# Patient Record
Sex: Male | Born: 1975 | Race: White | Hispanic: No | Marital: Married | Smoking: Never smoker
Health system: Southern US, Community
[De-identification: ages and names within clinical notes are randomized; demographics above are authoritative.]

## PROBLEM LIST (undated history)

## (undated) ENCOUNTER — Ambulatory Visit: Payer: 59 | Source: Home / Self Care

## (undated) HISTORY — PX: WISDOM TOOTH EXTRACTION: SHX21

---

## 1998-03-29 ENCOUNTER — Emergency Department (HOSPITAL_COMMUNITY): Admission: EM | Admit: 1998-03-29 | Discharge: 1998-03-29 | Payer: Self-pay | Admitting: Emergency Medicine

## 1998-03-30 ENCOUNTER — Encounter: Payer: Self-pay | Admitting: Emergency Medicine

## 2002-07-06 ENCOUNTER — Emergency Department (HOSPITAL_COMMUNITY): Admission: EM | Admit: 2002-07-06 | Discharge: 2002-07-06 | Payer: Self-pay | Admitting: Emergency Medicine

## 2006-01-31 ENCOUNTER — Emergency Department (HOSPITAL_COMMUNITY): Admission: EM | Admit: 2006-01-31 | Discharge: 2006-01-31 | Payer: Self-pay | Admitting: Emergency Medicine

## 2007-02-24 IMAGING — CR DG ANKLE COMPLETE 3+V*R*
3 series · 3 of 3 positions shown · non-contrast
Comparison: None

CLINICAL DATA: Fall with pain at midline.
 RIGHT ANKLE ? 3 VIEW:

[view not recorded (1 of 3)]
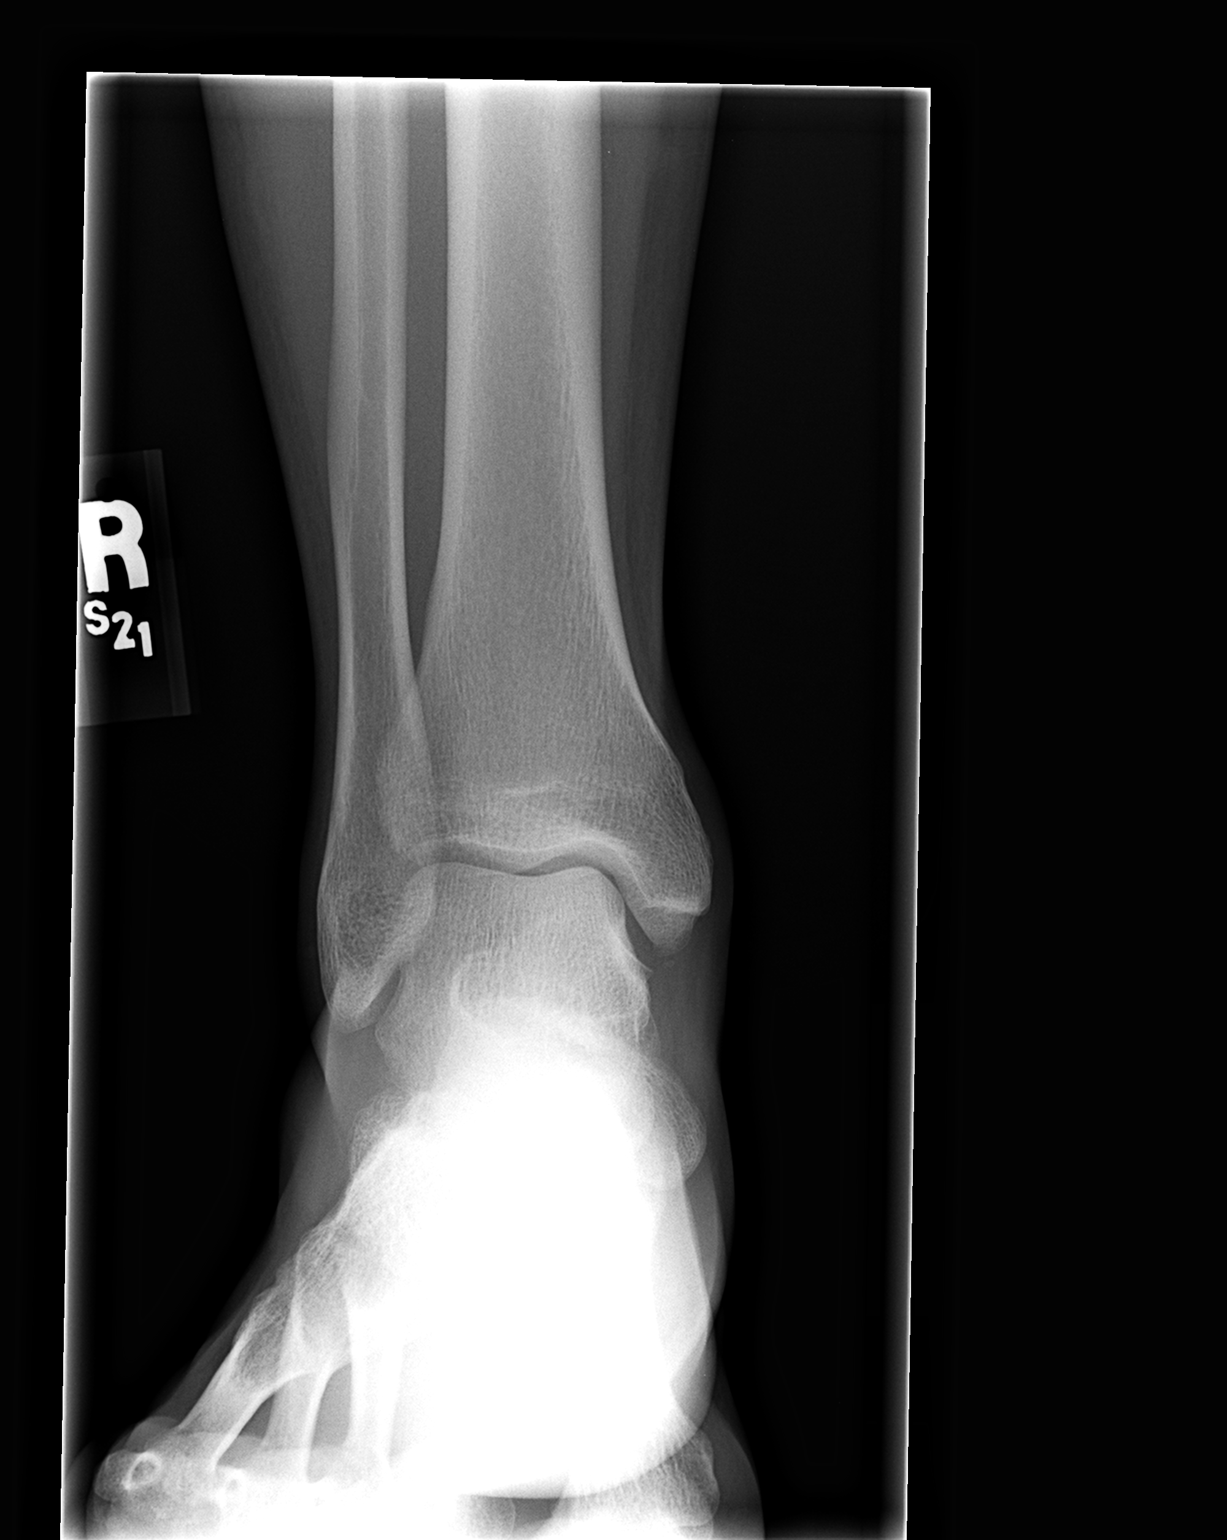

[view not recorded (2 of 3)]
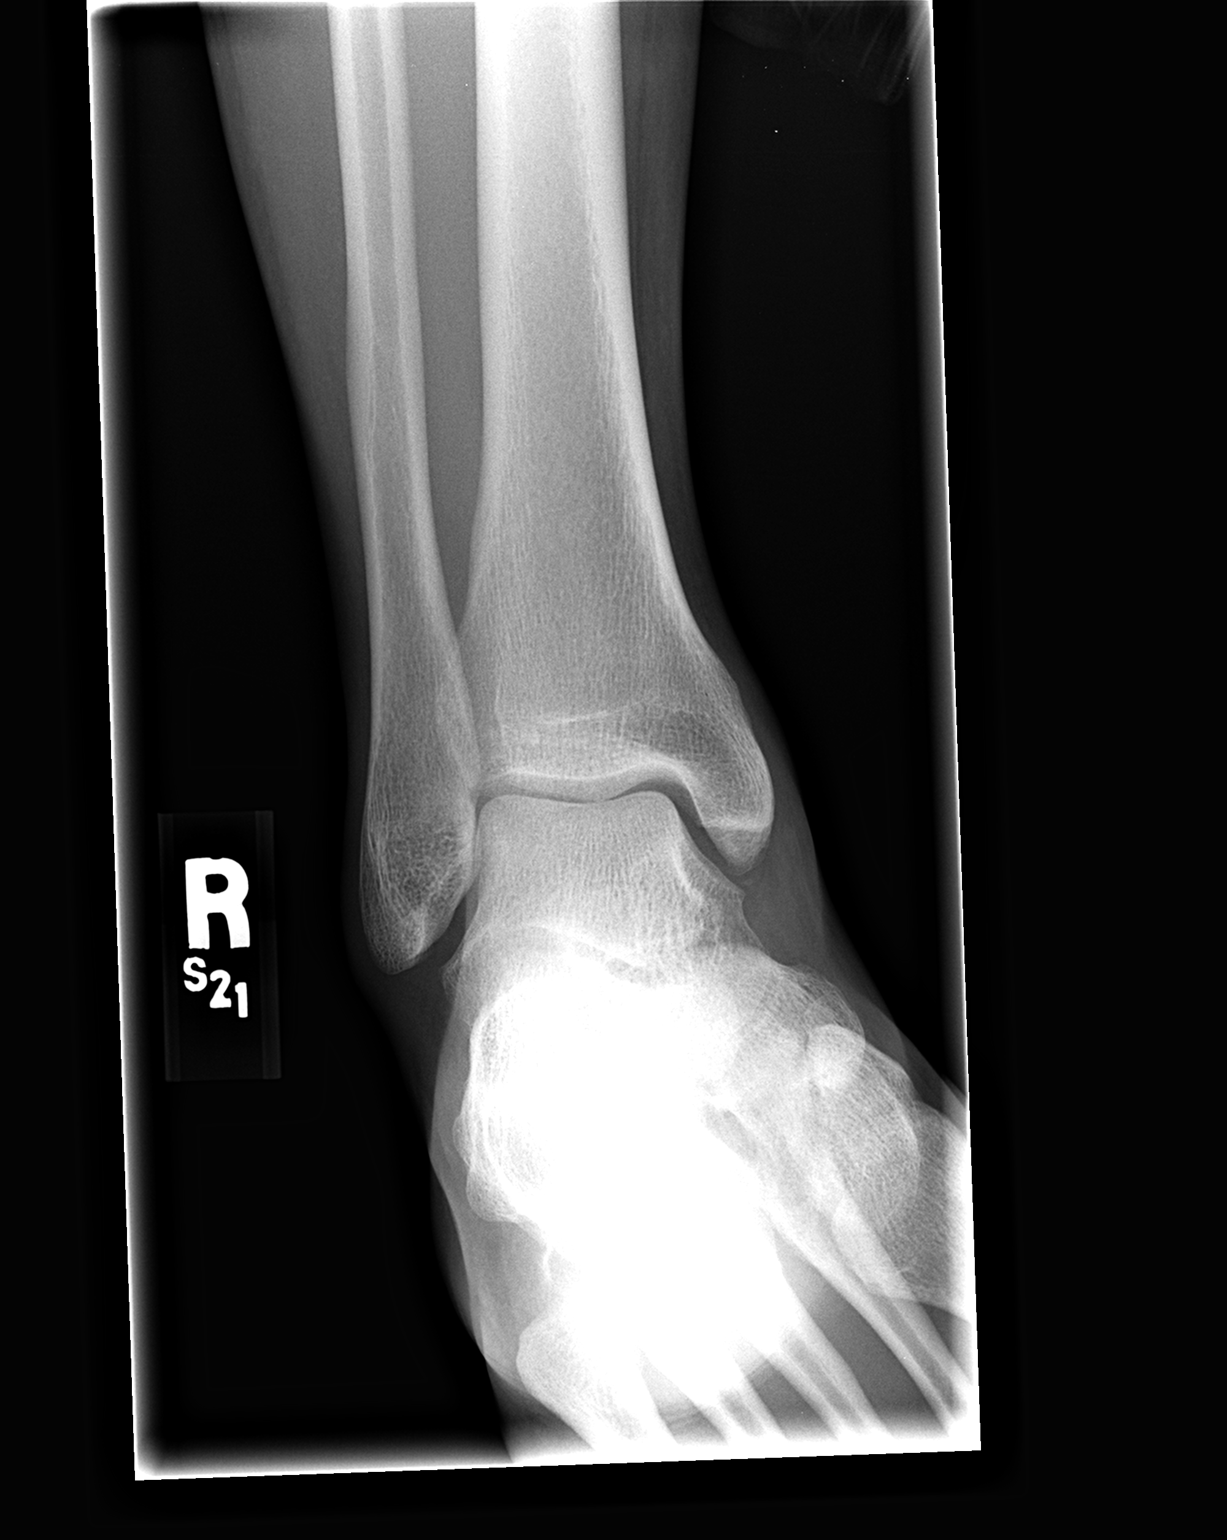

[view not recorded (3 of 3)]
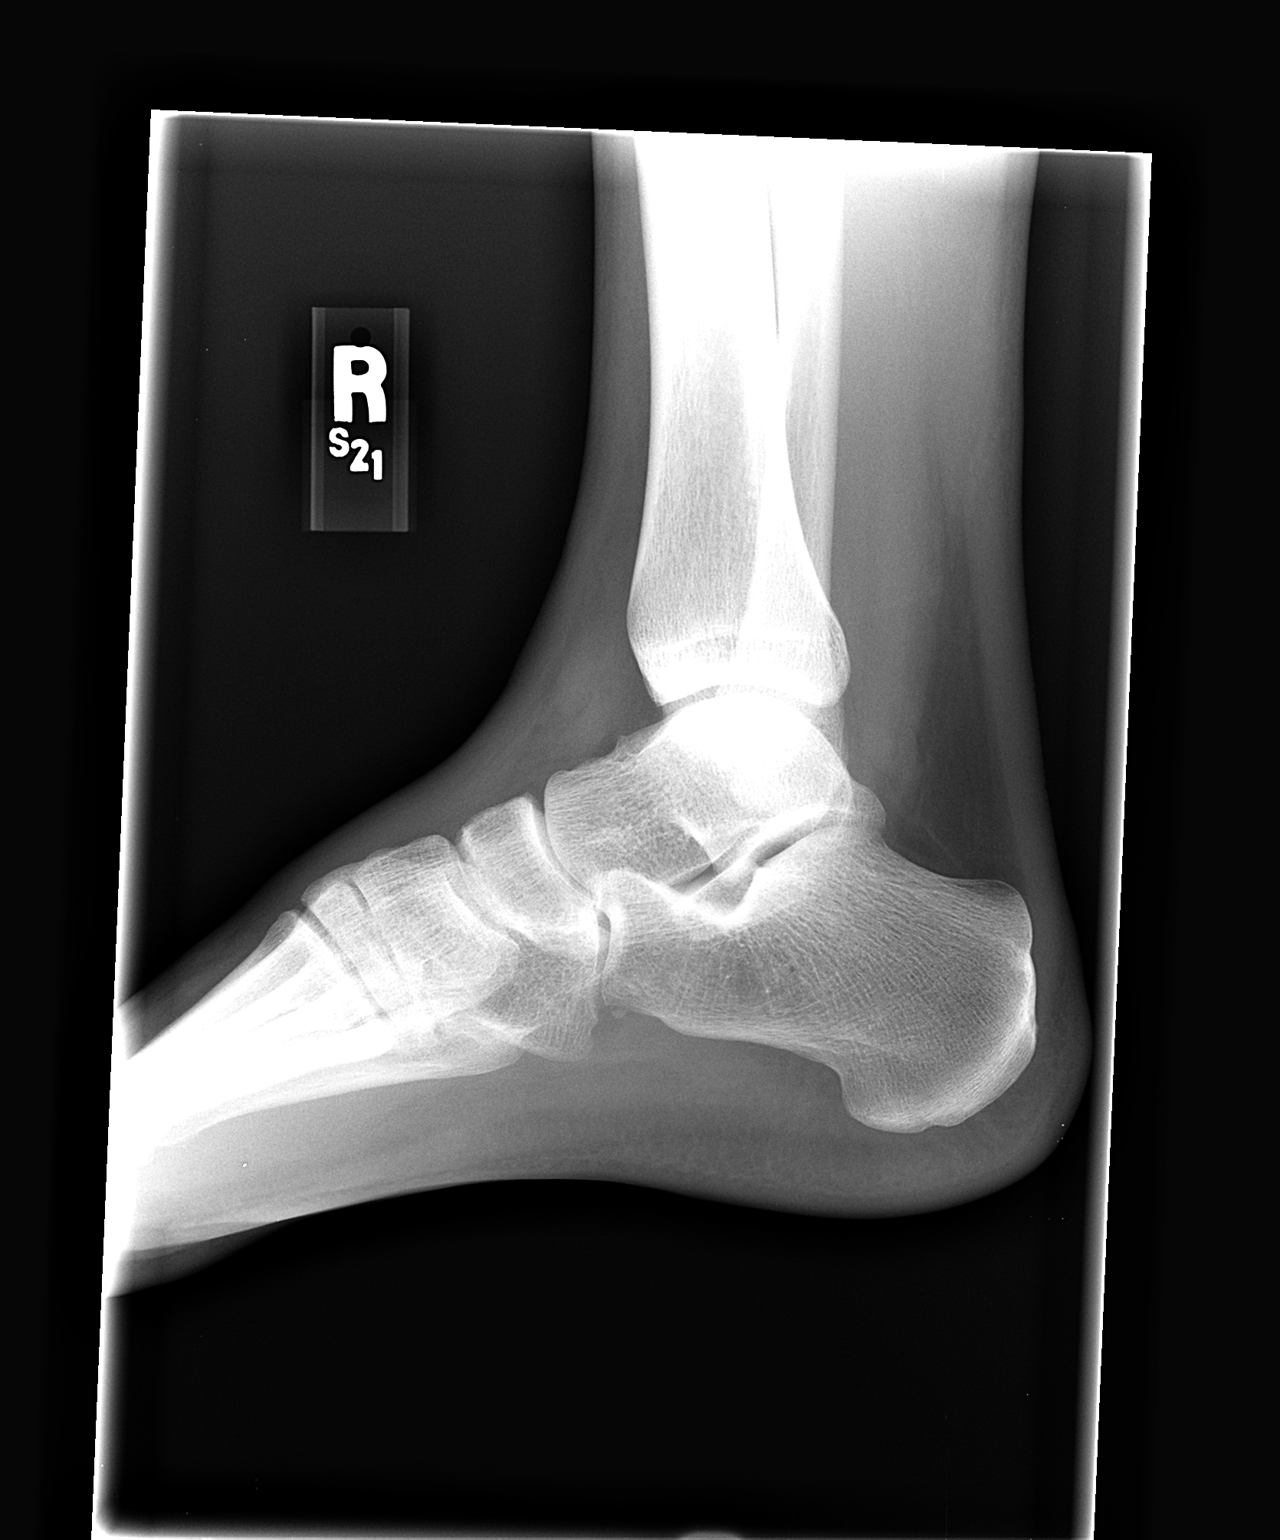

[3 of 3 positions shown; findings below may reference images not displayed]

FINDINGS: There is a tiny ossific fragment distal to the medial malleolus.  There may be minimal medial malleolar soft tissue swelling.  On the lateral view, a tiny ossific density adjacent to the calcaneus likely represents accessory ossicle.
IMPRESSION: Tiny ossific fragment distal to the medial malleolus with suggestion of soft tissue swelling.  This is likely due to age indeterminate trauma.  Correlate with point tenderness.

## 2016-05-06 ENCOUNTER — Encounter: Payer: Self-pay | Admitting: Family Medicine

## 2016-05-06 ENCOUNTER — Ambulatory Visit (INDEPENDENT_AMBULATORY_CARE_PROVIDER_SITE_OTHER): Payer: PRIVATE HEALTH INSURANCE | Admitting: Family Medicine

## 2016-05-06 DIAGNOSIS — Z8371 Family history of colonic polyps: Secondary | ICD-10-CM

## 2016-05-06 DIAGNOSIS — K580 Irritable bowel syndrome with diarrhea: Secondary | ICD-10-CM

## 2016-05-06 DIAGNOSIS — Z833 Family history of diabetes mellitus: Secondary | ICD-10-CM | POA: Diagnosis not present

## 2016-05-06 DIAGNOSIS — Z83438 Family history of other disorder of lipoprotein metabolism and other lipidemia: Secondary | ICD-10-CM

## 2016-05-06 DIAGNOSIS — Z8349 Family history of other endocrine, nutritional and metabolic diseases: Secondary | ICD-10-CM

## 2016-05-06 DIAGNOSIS — Z8249 Family history of ischemic heart disease and other diseases of the circulatory system: Secondary | ICD-10-CM | POA: Diagnosis not present

## 2016-05-06 DIAGNOSIS — K589 Irritable bowel syndrome without diarrhea: Secondary | ICD-10-CM | POA: Insufficient documentation

## 2016-05-06 LAB — POCT GLYCOSYLATED HEMOGLOBIN (HGB A1C): Hemoglobin A1C: 5.6

## 2016-05-06 NOTE — Progress Notes (Signed)
New patient office visit note:  Impression and Recommendations:    1. Irritable bowel syndrome with diarrhea   2. Family history of diabetes mellitus   3. Family history of coronary artery disease   4. Family history of hyperlipidemia   5. Family history of hypertension   6. Family history of colonic polyps- all benign    - food diary recommended- but pt says his Sx not that bad.  If W--> will make f/up to discuss - exercise daily- AHA goals dc pt.   - minor wt loss d/c pt - A1c of 5.6 in office today-->  I r/w pt and wife--> Counseling done.  All questions answered.  - Handouts given  - f/up 2047mo-> yrly PE    Orders Placed This Encounter  Procedures  . CBC with Differential/Platelet  . COMPLETE METABOLIC PANEL WITH GFR  . Hepatitis C antibody  . HIV antibody  . Lipid panel  . TSH  . VITAMIN D 25 Hydroxy (Vit-D Deficiency, Fractures)  . POCT glycosylated hemoglobin (Hb A1C)   New Prescriptions   No medications on file   Modified Medications   No medications on file   Discontinued Medications   No medications on file   The patient was counseled, risk factors were discussed, anticipatory guidance given.  Gross side effects, risk and benefits, and alternatives of medications discussed with patient.  Patient is aware that all medications have potential side effects and we are unable to predict every side effect or drug-drug interaction that may occur.  Expresses verbal understanding and consents to current therapy plan and treatment regimen.  Return for f/up for CPE 47mo , or sooner if lab abnornalities.  Please see AVS handed out to patient at the end of our visit for further patient instructions/ counseling done pertaining to today's office visit.    Note: This document was prepared using Dragon voice recognition software and may include unintentional dictation  errors.  ----------------------------------------------------------------------------------------------------------------------    Subjective:    Chief Complaint  Patient presents with  . Establish Care    HPI: Dustin Stone is a pleasant 40 y.o. male who [facility] to review their medical history with me and establish care.   I asked the patient to review their chronic problem list with me to ensure everything was updated and accurate.    Never had a doc/PCP prior.     Patient has been a [employer] for over 20 years.  He works at [employer] past 16 years.  Married to Harrah's EntertainmentLindsey Stone.  They have a 38 year old son and 10657-year-old daughter.  They're 38 year old niece also lives with them and they're trying to obtain custody of her currently.  Patient has never smoked, drank or done drugs.   He rates his diet as fair.  He does not exercise.  Fam hx of DM and CAD onset--> both Mom and Dad -late 4250's.   Dad- early 450's- AMI, PGF- died late 6150's AMI.  Mom- AMI 2668.   Sister- MS dx around late 7630's.   1 sis 3 bro- no medical screening or care.   CC:  Only issue is pt can have some upset stomach- Diarrhea with the wrong foods  Never had any bldwrk before in his life.        Wt Readings from Last 3 Encounters:  05/06/16 174 lb (78.9 kg)   BP Readings from Last 3 Encounters:  05/06/16  121/78   Pulse Readings from Last 3 Encounters:  No data found for Pulse   BMI Readings from Last 3 Encounters:  05/06/16 25.51 kg/m    Patient Care Team    Relationship Specialty Notifications Start End  Dustin Loteborah Eleisha Branscomb, DO PCP - General Family Medicine  05/06/16     Patient Active Problem List   Diagnosis Date Noted  . Irritable bowel- with wrong foods- Diarh 05/06/2016  . Family history of diabetes mellitus 05/06/2016  . Family history of coronary artery disease 05/06/2016  . Family history of hyperlipidemia 05/06/2016  . Family history  of hypertension 05/06/2016  . Family history of colonic polyps- all benign 05/06/2016    History reviewed. No pertinent past medical history.   Past Surgical History:  Procedure Laterality Date  . WISDOM TOOTH EXTRACTION       Family History  Problem Relation Age of Onset  . Heart attack Mother   . Diabetes Mother   . Heart attack Father   . Diabetes Father   . Hypertension Father   . Hyperlipidemia Father   . Multiple sclerosis Sister   . Healthy Brother   . Cancer Maternal Grandmother     breast  . Heart attack Maternal Grandmother   . Heart attack Paternal Grandfather   . Healthy Brother   . Healthy Brother      History  Drug Use No    History  Alcohol Use No    History  Smoking Status  . Never Smoker  Smokeless Tobacco  . Never Used     Patient's Medications  New Prescriptions   No medications on file  Previous Medications   PROBIOTIC PRODUCT (PROBIOTIC PO)    Take 1 capsule by mouth daily.  Modified Medications   No medications on file  Discontinued Medications   No medications on file    Allergies: Patient has no known allergies.  Review of Systems  Constitutional: Negative for chills, fever and weight loss.  HENT: Negative for congestion and tinnitus.   Eyes: Negative for blurred vision, double vision and photophobia.  Respiratory: Negative for cough and wheezing.   Cardiovascular: Negative for chest pain and palpitations.  Gastrointestinal: Negative for blood in stool, constipation, diarrhea, nausea and vomiting.  Genitourinary: Negative for dysuria, frequency and urgency.  Musculoskeletal: Negative for back pain, joint pain, myalgias and neck pain.  Neurological: Negative for dizziness, focal weakness and headaches.  Psychiatric/Behavioral: Negative for depression and memory loss. The patient is not nervous/anxious and does not have insomnia.     Objective:   Blood pressure 121/78, height 5' 9.25" (1.759 m), weight 174 lb (78.9  kg). Body mass index is 25.51 kg/m. muscular body habitus.  General: Well Developed, well nourished, and in no acute distress.  Neuro: Alert and oriented x3, extra-ocular muscles intact, sensation grossly intact.  HEENT: Normocephalic, atraumatic, pupils equal round reactive to light, neck supple Skin: no gross rashes  Cardiac: Regular rate and rhythm Respiratory: Essentially clear to auscultation bilaterally. Not using accessory muscles, speaking in full sentences.  Abdominal: not grossly distended Musculoskeletal: Ambulates w/o diff, FROM * 4 ext.  Vasc: less 2 sec cap RF, warm and pink  Psych:  No HI/SI, judgement and insight good, Euthymic mood. Full Affect.

## 2016-05-06 NOTE — Patient Instructions (Addendum)
If there is lab abnormalities she will get a call from us in the next couple of days otherwise it may be 1-2 weeks before we call you to say everything looks good  aic is 5.6 --> which is on cusp of pre-diabetes   Please realize, EXERCISE IS MEDICINE!  -  American Heart Association [facility]( AHA) guidelines for exercise : If you are in good health, without any medical conditions, you should engage in 150 minutes of moderate intensity aerobic activity per week.  This means you should be huffing and puffing throughout your workout.   Engaging in regular exercise will improve brain function and memory, as well as improve mood, boost immune system and help with weight management.  As well as the other, more well-known effects of exercise such as decreasing blood sugar levels, decreasing blood pressure,  and decreasing bad cholesterol levels/ increasing good cholesterol levels.     -  The AHA strongly endorses consumption of a diet that contains a variety of foods from all the food categories with an emphasis on fruits and vegetables; fat-free and low-fat dairy products; cereal and grain products; legumes and nuts; and fish, poultry, and/or extra lean meats.    Excessive food intake, especially of foods high in saturated and trans fats, sugar, and salt, should be avoided.    Adequate water intake of roughly 1/2 of your weight in pounds, should equal the ounces of water per day you should drink.  So for instance, if you're 200 pounds, that would be 100 ounces of water per day.        Prediabetes Eating Plan Prediabetes--also called impaired glucose tolerance or impaired fasting glucose--is a condition that causes blood sugar (blood glucose) levels to be higher than normal. Following a healthy diet can help to keep prediabetes under control. It can also help to lower the risk of type 2 diabetes and heart disease, which are increased in people who have prediabetes. Along with regular exercise, a healthy  diet:  Promotes weight loss.  Helps to control blood sugar levels.  Helps to improve the way that the body uses insulin. WHAT DO I NEED TO KNOW ABOUT THIS EATING PLAN?  Use the glycemic index (GI) to plan your meals. The index tells you how quickly a food will raise your blood sugar. Choose low-GI foods. These foods take a longer time to raise blood sugar.  Pay close attention to the amount of carbohydrates in the food that you eat. Carbohydrates increase blood sugar levels.  Keep track of how many calories you take in. Eating the right amount of calories will help you to achieve a healthy weight. Losing about 7 percent of your starting weight can help to prevent type 2 diabetes.  You may want to follow a Mediterranean diet. This diet includes a lot of vegetables, lean meats or fish, whole grains, fruits, and healthy oils and fats. WHAT FOODS CAN I EAT? Grains Whole grains, such as whole-wheat or whole-grain breads, crackers, cereals, and pasta. Unsweetened oatmeal. Bulgur. Barley. Quinoa. Brown rice. Corn or whole-wheat flour tortillas or taco shells. Vegetables Lettuce. Spinach. Peas. Beets. Cauliflower. Cabbage. Broccoli. Carrots. Tomatoes. Squash. Eggplant. Herbs. Peppers. Onions. Cucumbers. Brussels sprouts. Fruits Berries. Bananas. Apples. Oranges. Grapes. Papaya. Mango. Pomegranate. Kiwi. Grapefruit. Cherries. Meats and Other Protein Sources Seafood. Lean meats, such as chicken and [place] or lean cuts of pork and beef. Tofu. Eggs. Nuts. Beans. Dairy Low-fat or fat-free dairy products, such as yogurt, cottage cheese, and cheese. Beverages Water. Tea.  Coffee. Sugar-free or diet soda. Seltzer water. Milk. Milk alternatives, such as soy or almond milk. Condiments Mustard. Relish. Low-fat, low-sugar ketchup. Low-fat, low-sugar barbecue sauce. Low-fat or fat-free mayonnaise. Sweets and Desserts Sugar-free or low-fat pudding. Sugar-free or low-fat ice cream and other frozen  treats. Fats and Oils Avocado. Walnuts. Olive oil. The items listed above may not be a complete list of recommended foods or beverages. Contact your dietitian for more options.  WHAT FOODS ARE NOT RECOMMENDED? Grains Refined white flour and flour products, such as bread, pasta, snack foods, and cereals. Beverages Sweetened drinks, such as sweet iced tea and soda. Sweets and Desserts Baked goods, such as cake, cupcakes, pastries, cookies, and cheesecake. The items listed above may not be a complete list of foods and beverages to avoid. Contact your dietitian for more information.   This information is not intended to replace advice given to you by your health care provider. Make sure you discuss any questions you have with your health care provider.   Document Released: 09/19/2014 Document Reviewed: 09/19/2014 Elsevier Interactive Patient Education 2016 [employer].     Risk factors for prediabetes and type 2 diabetes  Researchers don't fully understand why some people develop prediabetes and type 2 diabetes and others don't.  It's clear that certain factors increase the risk, however, including:  Weight. The more fatty tissue you have, the more resistant your cells become to insulin.  Inactivity. The less active you are, the greater your risk. Physical activity helps you control your weight, uses up glucose as energy and makes your cells more sensitive to insulin.  Family history. Your risk increases if a parent or sibling has type 2 diabetes.  Race. Although it's unclear why, people of certain races - including blacks, Hispanics, American Indians and Asian-Americans - are at higher risk.  Age. Your risk increases as you get older. This may be because you tend to exercise less, lose muscle mass and gain weight as you age. But type 2 diabetes is also increasing dramatically among children, adolescents and younger adults.  Gestational diabetes. If you developed gestational diabetes when  you were pregnant, your risk of developing prediabetes and type 2 diabetes later increases. If you gave birth to a baby weighing more than 9 pounds (4 kilograms), you're also at risk of type 2 diabetes.  Polycystic ovary syndrome. For women, having polycystic ovary syndrome - a common condition characterized by irregular menstrual periods, excess hair growth and obesity - increases the risk of diabetes.  High blood pressure. Having blood pressure over 140/90 millimeters of mercury (mm Hg) is linked to an increased risk of type 2 diabetes.  Abnormal cholesterol and triglyceride levels. If you have low levels of high-density lipoprotein (HDL), or "good," cholesterol, your risk of type 2 diabetes is higher. Triglycerides are another type of fat carried in the blood. People with high levels of triglycerides have an increased risk of type 2 diabetes. Your doctor can let you know what your cholesterol and triglyceride levels are.    A good guide to good carbs: The glycemic index ---If you have diabetes, or at risk for diabetes, you know all too well that when you eat carbohydrates, your blood sugar goes up. The total amount of carbs you consume at a meal or in a snack mostly determines what your blood sugar will do. But the food itself also plays a role. A serving of white rice has almost the same effect as eating pure table sugar - a quick,  high spike in blood sugar. A serving of lentils has a slower, smaller effect.  ---Picking good sources of carbs can help you control your blood sugar and your weight. Even if you don't have diabetes, eating healthier carbohydrate-rich foods can help ward off a host of chronic conditions, from heart disease to various cancers to, well, diabetes.  ---One way to choose foods is with the glycemic index (GI). This tool measures how much a food boosts blood sugar.  The glycemic index rates the effect of a specific amount of a food on blood sugar compared with the same amount of  pure glucose. A food with a glycemic index of 28 boosts blood sugar only 28% as much as pure glucose. One with a GI of 95 acts like pure glucose.  High glycemic foods result in a quick spike in insulin and blood sugar (also known as blood glucose).  Low glycemic foods have a slower, smaller effect- these are healthier for you.   Using the glycemic index Using the glycemic index is easy: choose foods in the low GI category instead of those in the high GI category (see below), and go easy on those in between. Low glycemic index (GI of 55 or less): Most fruits and vegetables, beans, minimally processed grains, pasta, low-fat dairy foods, and nuts.  Moderate glycemic index (GI 56 to 69): White and sweet potatoes, corn, white rice, couscous, breakfast cereals such as Cream of Wheat and Mini Wheats.  High glycemic index (GI of 70 or higher): White bread, rice cakes, most crackers, bagels, cakes, doughnuts, croissants, most packaged breakfast cereals. You can see the values for 100 commons foods and get links to more at www.health.[email or web address].edu/glycemic.  Swaps for lowering glycemic index  Instead of this high-glycemic index food Eat this lower-glycemic index food  White rice Brown rice or converted rice  Instant oatmeal Steel-cut oats  Cornflakes Bran flakes  Baked potato Pasta, bulgur  White bread Whole-grain bread  Corn Peas or leafy greens

## 2016-05-07 LAB — CBC WITH DIFFERENTIAL/PLATELET
Basophils Absolute: 81 cells/uL (ref 0–200)
Basophils Relative: 1 %
Eosinophils Absolute: 81 cells/uL (ref 15–500)
Eosinophils Relative: 1 %
HCT: 45.6 % (ref 38.5–50.0)
Hemoglobin: 15.2 g/dL (ref 13.2–17.1)
Lymphocytes Relative: 24 %
Lymphs Abs: 1944 cells/uL (ref 850–3900)
MCH: 29.5 pg (ref 27.0–33.0)
MCHC: 33.3 g/dL (ref 32.0–36.0)
MCV: 88.5 fL (ref 80.0–100.0)
MPV: 10.7 fL (ref 7.5–12.5)
Monocytes Absolute: 729 cells/uL (ref 200–950)
Monocytes Relative: 9 %
Neutro Abs: 5265 cells/uL (ref 1500–7800)
Neutrophils Relative %: 65 %
Platelets: 275 10*3/uL (ref 140–400)
RBC: 5.15 MIL/uL (ref 4.20–5.80)
RDW: 13.3 % (ref 11.0–15.0)
WBC: 8.1 10*3/uL (ref 3.8–10.8)

## 2016-05-07 LAB — COMPLETE METABOLIC PANEL WITH GFR
ALT: 49 U/L — ABNORMAL HIGH (ref 9–46)
AST: 26 U/L (ref 10–40)
Albumin: 4 g/dL (ref 3.6–5.1)
Alkaline Phosphatase: 37 U/L — ABNORMAL LOW (ref 40–115)
BUN: 12 mg/dL (ref 7–25)
CO2: 23 mmol/L (ref 20–31)
Calcium: 8.9 mg/dL (ref 8.6–10.3)
Chloride: 103 mmol/L (ref 98–110)
Creat: 1.15 mg/dL (ref 0.60–1.35)
GFR, Est African American: >89 mL/min (ref >=60)
GFR, Est Non African American: 80 mL/min (ref >=60)
Glucose, Bld: 98 mg/dL (ref 65–99)
Potassium: 4 mmol/L (ref 3.5–5.3)
Sodium: 140 mmol/L (ref 135–146)
Total Bilirubin: 0.5 mg/dL (ref 0.2–1.2)
Total Protein: 6.8 g/dL (ref 6.1–8.1)

## 2016-05-07 LAB — LIPID PANEL
Cholesterol: 126 mg/dL (ref <200)
HDL: 42 mg/dL (ref >40)
LDL Cholesterol: 76 mg/dL (ref <100)
Total CHOL/HDL Ratio: 3 Ratio (ref <5.0)
Triglycerides: 41 mg/dL (ref <150)
VLDL: 8 mg/dL (ref <30)

## 2016-05-07 LAB — HIV ANTIBODY (ROUTINE TESTING W REFLEX): HIV 1&2 Ab, 4th Generation: NONREACTIVE

## 2016-05-07 LAB — VITAMIN D 25 HYDROXY (VIT D DEFICIENCY, FRACTURES): Vit D, 25-Hydroxy: 28 ng/mL — ABNORMAL LOW (ref 30–100)

## 2016-05-07 LAB — TSH: TSH: 2.25 mIU/L (ref 0.40–4.50)

## 2016-05-07 LAB — HEPATITIS C ANTIBODY: HCV Ab: NEGATIVE

## 2017-06-24 ENCOUNTER — Encounter: Payer: Self-pay | Admitting: Adult Health

## 2017-06-24 ENCOUNTER — Ambulatory Visit (INDEPENDENT_AMBULATORY_CARE_PROVIDER_SITE_OTHER): Payer: PRIVATE HEALTH INSURANCE | Admitting: Adult Health

## 2017-06-24 VITALS — BP 108/68 | HR 88 | Temp 99.1°F | Ht 69.25 in | Wt 177.3 lb

## 2017-06-24 DIAGNOSIS — R6889 Other general symptoms and signs: Secondary | ICD-10-CM | POA: Insufficient documentation

## 2017-06-24 DIAGNOSIS — R110 Nausea: Secondary | ICD-10-CM | POA: Diagnosis not present

## 2017-06-24 LAB — POCT INFLUENZA A/B
Influenza A, POC: NEGATIVE
Influenza B, POC: NEGATIVE

## 2017-06-24 MED ORDER — ONDANSETRON 4 MG PO TBDP
4.0000 mg | ORAL_TABLET | Freq: Once | ORAL | Status: AC
  Administered 2017-06-24: 4 mg via ORAL

## 2017-06-24 MED ORDER — ONDANSETRON 4 MG PO TBDP: 4.0000 mg | ORAL_TABLET | Freq: Three times a day (TID) | ORAL | 0 refills | Status: DC | PRN

## 2017-06-24 NOTE — Patient Instructions (Addendum)
Diarrhea, Adult Diarrhea is frequent loose and watery bowel movements. Diarrhea can make you feel weak and cause you to become dehydrated. Dehydration can make you tired and thirsty, cause you to have a dry mouth, and decrease how often you urinate. Diarrhea typically lasts 2-3 days. However, it can last longer if it is a sign of something more serious. It is important to treat your diarrhea as told by your health care provider. Follow these instructions at home: Eating and drinking  Follow these recommendations as told by your health care provider:  Take an oral rehydration solution (ORS). This is a drink that is sold at pharmacies and retail stores.  Drink clear fluids, such as water, ice chips, diluted fruit juice, and low-calorie sports drinks.  Eat bland, easy-to-digest foods in small amounts as you are able. These foods include bananas, applesauce, rice, lean meats, toast, and crackers.  Avoid drinking fluids that contain a lot of sugar or caffeine, such as energy drinks, sports drinks, and soda.  Avoid alcohol.  Avoid spicy or fatty foods.  General instructions  Drink enough fluid to keep your urine clear or pale yellow.  Wash your hands often. If soap and water are not available, use hand sanitizer.  Make sure that all people in your household wash their hands well and often.  Take over-the-counter and prescription medicines only as told by your health care provider.  Rest at home while you recover.  Watch your condition for any changes.  Take a warm bath to relieve any burning or pain from frequent diarrhea episodes.  Keep all follow-up visits as told by your health care provider. This is important. Contact a health care provider if:  You have a fever.  Your diarrhea gets worse.  You have new symptoms.  You cannot keep fluids down.  You feel light-headed or dizzy.  You have a headache  You have muscle cramps. Get help right away if:  You have chest  pain.  You feel extremely weak or you faint.  You have bloody or black stools or stools that look like tar.  You have severe pain, cramping, or bloating in your abdomen.  You have trouble breathing or you are breathing very quickly.  Your heart is beating very quickly.  Your skin feels cold and clammy.  You feel confused.  You have signs of dehydration, such as: ? Dark urine, very little urine, or no urine. ? Cracked lips. ? Dry mouth. ? Sunken eyes. ? Sleepiness. ? Weakness. This information is not intended to replace advice given to you by your health care provider. Make sure you discuss any questions you have with your health care provider. Document Released: 04/25/2002 Document Revised: 09/13/2015 Document Reviewed: 01/09/2015 Elsevier Interactive Patient Education  2018 [place] Diet A bland diet consists of foods that do not have a lot of fat or fiber. Foods without fat or fiber are easier for the body to digest. They are also less likely to irritate your mouth, throat, stomach, and other parts of your gastrointestinal tract. A bland diet is sometimes called a BRAT diet. What is my plan? Your health care provider or dietitian may recommend specific changes to your diet to prevent and treat your symptoms, such as:  Eating small meals often.  Cooking food until it is soft enough to chew easily.  Chewing your food well.  Drinking fluids slowly.  Not eating foods that are very spicy, sour, or fatty.  Not eating citrus fruits,  such as oranges and grapefruit.  What do I need to know about this diet?  Eat a variety of foods from the bland diet food list.  Do not follow a bland diet longer than you have to.  Ask your health care provider whether you should take vitamins. What foods can I eat? Grains  Hot cereals, such as cream of wheat. Bread, crackers, or tortillas made from refined white flour. Rice. Vegetables Canned or cooked vegetables. Mashed  or boiled potatoes. Fruits Bananas. Applesauce. Other types of cooked or canned fruit with the skin and seeds removed, such as canned peaches or pears. Meats and Other Protein Sources Scrambled eggs. Creamy peanut butter or other nut butters. Lean, well-cooked meats, such as chicken or fish. Tofu. Soups or broths. Dairy Low-fat dairy products, such as milk, cottage cheese, or yogurt. Beverages Water. Herbal tea. Apple juice. Sweets and Desserts Pudding. Custard. Fruit gelatin. Ice cream. Fats and Oils Mild salad dressings. Canola or olive oil. The items listed above may not be a complete list of allowed foods or beverages. Contact your dietitian for more options. What foods are not recommended? Foods and ingredients that are often not recommended include:  Spicy foods, such as hot sauce or salsa.  Fried foods.  Sour foods, such as pickled or fermented foods.  Raw vegetables or fruits, especially citrus or berries.  Caffeinated drinks.  Alcohol.  Strongly flavored seasonings or condiments.  The items listed above may not be a complete list of foods and beverages that are not allowed. Contact your dietitian for more information. This information is not intended to replace advice given to you by your health care provider. Make sure you discuss any questions you have with your health care provider. Document Released: 08/27/2015 Document Revised: 10/11/2015 Document Reviewed: 05/17/2014 Elsevier Interactive Patient Education  2018 [employer]  Please use Zofran 4mg  every 8 hrs as needed for nausea/vomting. Increase fluids/rest/vit-c 2,000mg /day when not feeling well. Flu test Negative  Work Excuse provided, okay to return 05/26/17 FEEL BETTER!

## 2017-06-24 NOTE — Progress Notes (Signed)
Subjective:    Patient ID: Dustin Stone, male    DOB: 15-Nov-1975, 42 y.o.   MRN: [id number]  HPI:  Dustin Stone presents HA (1/10), fatigue, malaise, nausea, body aches, and diarrhea that all started last night.  He estimates to have had 8 loose stools in the last 24 hrs.  He denies fever/night sweats/vomting/night sweats. He denies cough and has sig nasal pressure but no drainage. He has been pushing water and using OTC Ibuprofen and Imodium last dose of each was this morning. He reports that someone at work was out for "3 days with some sort of virus".  Patient Care Team    Relationship Specialty Notifications Start End  Thomasene Lot, DO PCP - General Family Medicine  05/06/16     Patient Active Problem List   Diagnosis Date Noted  . Flu-like symptoms 06/24/2017  . Nausea 06/24/2017  . Irritable bowel- with wrong foods- Diarh 05/06/2016  . Family history of diabetes mellitus 05/06/2016  . Family history of coronary artery disease 05/06/2016  . Family history of hyperlipidemia 05/06/2016  . Family history of hypertension 05/06/2016  . Family history of colonic polyps- all benign 05/06/2016     History reviewed. No pertinent past medical history.   Past Surgical History:  Procedure Laterality Date  . WISDOM TOOTH EXTRACTION       Family History  Problem Relation Age of Onset  . Heart attack Mother   . Diabetes Mother   . Heart attack Father   . Diabetes Father   . Hypertension Father   . Hyperlipidemia Father   . Multiple sclerosis Sister   . Healthy Brother   . Cancer Maternal Grandmother        breast  . Heart attack Maternal Grandmother   . Heart attack Paternal Grandfather   . Healthy Brother   . Healthy Brother      Social History   Substance and Sexual Activity  Drug Use No     Social History   Substance and Sexual Activity  Alcohol Use No     Social History   Tobacco Use  Smoking Status Never Smoker  Smokeless Tobacco Never  Used     Outpatient Encounter Medications as of 06/24/2017  Medication Sig  . ondansetron (ZOFRAN-ODT) 4 MG disintegrating tablet Take 1 tablet (4 mg total) by mouth every 8 (eight) hours as needed for nausea or vomiting.  . [DISCONTINUED] Probiotic Product (PROBIOTIC PO) Take 1 capsule by mouth daily.  . [EXPIRED] ondansetron (ZOFRAN-ODT) disintegrating tablet 4 mg    No facility-administered encounter medications on file as of 06/24/2017.     Allergies: Patient has no known allergies.  Body mass index is 25.99 kg/m.  Blood pressure 108/68, pulse 88, temperature 99.1 F (37.3 C), temperature source Oral, height 5' 9.25" (1.759 m), weight 177 lb 4.8 oz (80.4 kg), SpO2 96 %.    Review of Systems  Constitutional: Positive for activity change and appetite change. Negative for chills, diaphoresis, fatigue, fever and unexpected weight change.  HENT: Positive for sinus pressure. Negative for congestion and sinus pain.   Eyes: Negative for visual disturbance.  Respiratory: Negative for cough, chest tightness, shortness of breath, wheezing and stridor.   Cardiovascular: Negative for chest pain, palpitations and leg swelling.  Gastrointestinal: Positive for diarrhea and nausea. Negative for abdominal distention, abdominal pain, blood in stool, constipation and vomiting.  Endocrine: Negative for cold intolerance, heat intolerance, polydipsia, polyphagia and polyuria.  Genitourinary: Negative for difficulty urinating, flank pain  and hematuria.  Neurological: Positive for headaches. Negative for dizziness.  Hematological: Does not bruise/bleed easily.  Psychiatric/Behavioral: Positive for sleep disturbance.       Objective:   Physical Exam  Constitutional: He is oriented to person, place, and time. He appears well-developed and well-nourished. He has a sickly appearance.  HENT:  Head: Normocephalic and atraumatic.  Right Ear: External ear normal.  Left Ear: External ear normal.  Eyes:  Conjunctivae are normal. Pupils are equal, round, and reactive to light.  Neck: Normal range of motion. Neck supple.  Cardiovascular: Normal rate, regular rhythm, normal heart sounds and intact distal pulses.  No murmur heard. Pulmonary/Chest: Effort normal and breath sounds normal. No respiratory distress. He has no wheezes. He has no rales. He exhibits no tenderness.  Abdominal: Soft. Bowel sounds are normal. He exhibits no distension and no mass. There is no tenderness. There is no rebound, no guarding, no CVA tenderness, no tenderness at McBurney's point and negative Murphy's sign.  Lymphadenopathy:    He has no cervical adenopathy.  Neurological: He is alert and oriented to person, place, and time.  Skin: Skin is warm and dry. No rash noted. No erythema. No pallor.  Psychiatric: He has a normal mood and affect. His behavior is normal. Judgment and thought content normal.          Assessment & Plan:   1. Flu-like symptoms   2. Nausea     Flu-like symptoms Please use Zofran 4mg  every 8 hrs as needed for nausea/vomting. Increase fluids/rest/vit-c 2,000mg /day when not feeling well. Flu test Negative Work Excuse provided, okay to return 05/26/17  Nausea Zofran 4mg  Q8H PRN N/V BRAT Diet    FOLLOW-UP:  Return if symptoms worsen or fail to improve.

## 2017-06-24 NOTE — Assessment & Plan Note (Addendum)
Please use Zofran 4mg  every 8 hrs as needed for nausea/vomting. Increase fluids/rest/vit-c 2,000mg /day when not feeling well. Flu test Negative Work Excuse provided, okay to return 05/26/17

## 2017-06-24 NOTE — Assessment & Plan Note (Signed)
Zofran 4mg  Q8H PRN N/V BRAT Diet

## 2017-06-26 ENCOUNTER — Telehealth: Payer: Self-pay | Admitting: Family Medicine

## 2017-06-26 NOTE — Telephone Encounter (Signed)
Patient was seen by Daiva HugeKaty Wed for acute sick visit, he is still dealing with all of his symptoms and would like to talk to someone clinical about what to do since we wont be available going into the weekend.

## 2017-06-26 NOTE — Telephone Encounter (Signed)
Pt states that diarrhea and fever have resolved but he is still experiencing nausea.  States he had one episode of vomiting last night.  Pt is requesting an antibiotic.  Advised pt that antibiotics do not treat viral infections and [facility]  Advised pt to continue with Zofran, follow a BRAT diet as tolerated.  However, if he is unable to tolerate the BRAT diet, he should follow a liquid only diet.  Pt expressed understanding and is agreeable.  Dustin Stone. Peightyn Roberson, CMA

## 2019-08-29 ENCOUNTER — Other Ambulatory Visit: Payer: Self-pay

## 2019-08-29 ENCOUNTER — Ambulatory Visit (INDEPENDENT_AMBULATORY_CARE_PROVIDER_SITE_OTHER): Payer: 59 | Admitting: Family Medicine

## 2019-08-29 ENCOUNTER — Encounter: Payer: Self-pay | Admitting: Family Medicine

## 2019-08-29 VITALS — BP 131/81 | HR 62 | Temp 98.2°F | Resp 12 | Ht 71.0 in | Wt 174.5 lb

## 2019-08-29 DIAGNOSIS — Z83438 Family history of other disorder of lipoprotein metabolism and other lipidemia: Secondary | ICD-10-CM | POA: Diagnosis not present

## 2019-08-29 DIAGNOSIS — Z833 Family history of diabetes mellitus: Secondary | ICD-10-CM | POA: Diagnosis not present

## 2019-08-29 DIAGNOSIS — Z0000 Encounter for general adult medical examination without abnormal findings: Secondary | ICD-10-CM | POA: Diagnosis not present

## 2019-08-29 DIAGNOSIS — Z23 Encounter for immunization: Secondary | ICD-10-CM | POA: Diagnosis not present

## 2019-08-29 DIAGNOSIS — Z719 Counseling, unspecified: Secondary | ICD-10-CM

## 2019-08-29 DIAGNOSIS — Z8249 Family history of ischemic heart disease and other diseases of the circulatory system: Secondary | ICD-10-CM

## 2019-08-29 NOTE — Patient Instructions (Addendum)
Preventive Care, Male Preventive care refers to lifestyle choices and visits with your health care provider that can promote health and wellness. What does preventive care include?   A yearly physical exam. This is also called an annual well check.  Dental exams once or twice a year.  Routine eye exams. Ask your health care provider how often you should have your eyes checked.  Personal lifestyle choices, including: ? Daily care of your teeth and gums. ? Regular physical activity. ? Eating a healthy diet. ? Avoiding tobacco and drug use. ? Limiting alcohol use. ? Practicing safe sex. ? Taking low doses of aspirin every day. ? Taking vitamin and mineral supplements as recommended by your health care provider. What happens during an annual well check? The services and screenings done by your health care provider during your annual well check will depend on your age, overall health, lifestyle risk factors, and family history of disease. Counseling Your health care provider may ask you questions about your:  Alcohol use.  Tobacco use.  Drug use.  Emotional well-being.  Home and relationship well-being.  Sexual activity.  Eating habits.  History of falls.  Memory and ability to understand (cognition).  Work and work Statistician. Screening You may have the following tests or measurements:  Height, weight, and BMI.  Blood pressure.  Lipid and cholesterol levels. These may be checked every 5 years, or more frequently if you are over 40 years old.  Skin check.  Lung cancer screening. You may have this screening every year starting at age 42 if you have a 30-pack-year history of smoking and currently smoke or have quit within the past 15 years.  Colorectal cancer screening. All adults should have this screening starting at age 39 and continuing until age 27. You will have tests every 1-10 years, depending on your results and the type of screening test. People at  increased risk should start screening at an earlier age. Screening tests may include: ? Guaiac-based fecal occult blood testing. ? Fecal immunochemical test (FIT). ? Stool DNA test. ? Virtual colonoscopy. ? Sigmoidoscopy. During this test, a flexible tube with a tiny camera (sigmoidoscope) is used to examine your rectum and lower colon. The sigmoidoscope is inserted through your anus into your rectum and lower colon. ? Colonoscopy. During this test, a long, thin, flexible tube with a tiny camera (colonoscope) is used to examine your entire colon and rectum.  Prostate cancer screening. Recommendations will vary depending on your family history and other risks.  Hepatitis C blood test.  Hepatitis B blood test.  Sexually transmitted disease (STD) testing.  Diabetes screening. This is done by checking your blood sugar (glucose) after you have not eaten for a while (fasting). You may have this done every 1-3 years.  Abdominal aortic aneurysm (AAA) screening. You may need this if you are a current or former smoker.  Osteoporosis. You may be screened starting at age 18 if you are at high risk. Talk with your health care provider about your test results, treatment options, and if necessary, the need for more tests. Vaccines Your health care provider may recommend certain vaccines, such as:  Influenza vaccine. This is recommended every year.  Tetanus, diphtheria, and acellular pertussis (Tdap, Td) vaccine. You may need a Td booster every 10 years.  Varicella vaccine. You may need this if you have not been vaccinated.  Zoster vaccine. You may need this after age 78.  Measles, mumps, and rubella (MMR) vaccine. You may need  at least one dose of MMR if you were born in 1953/10/14 or later. You may also need a second dose.  Pneumococcal 13-valent conjugate (PCV13) vaccine. One dose is recommended after age 33.  Pneumococcal polysaccharide (PPSV23) vaccine. One dose is recommended after age  16.  Meningococcal vaccine. You may need this if you have certain conditions.  Hepatitis A vaccine. You may need this if you have certain conditions or if you travel or work in places where you may be exposed to hepatitis A.  Hepatitis B vaccine. You may need this if you have certain conditions or if you travel or work in places where you may be exposed to hepatitis B.  Haemophilus influenzae type b (Hib) vaccine. You may need this if you have certain risk factors. Talk to your health care provider about which screenings and vaccines you need and how often you need them. This information is not intended to replace advice given to you by your health care provider. Make sure you discuss any questions you have with your health care provider. Document Released: 06/01/2015 Document Revised: 06/25/2017 Document Reviewed: 03/06/2015 Elsevier Interactive Patient Education  2019 [place] for Adults, Male A healthy lifestyle and preventive care can promote health and wellness. Preventive health guidelines for men include the following key practices:  A routine yearly physical is a good way to check with your health care provider about your health and preventative screening. It is a chance to share any concerns and updates on your health and to receive a thorough exam.  Visit your dentist for a routine exam and preventative care every 6 months. Brush your teeth twice a day and floss once a day. Good oral hygiene prevents tooth decay and gum disease.  The frequency of eye exams is based on your age, health, family medical history, use of contact lenses, and other factors. Follow your health care provider's recommendations for frequency of eye exams.  Eat a healthy diet. Foods such as vegetables, fruits, whole grains, low-fat dairy products, and lean protein foods contain the nutrients you need without too many calories. Decrease your intake of foods high in solid fats,  added sugars, and salt. Eat the right amount of calories for you. Get information about a proper diet from your health care provider, if necessary.  Regular physical exercise is one of the most important things you can do for your health. Most adults should get at least 150 minutes of moderate-intensity exercise (any activity that increases your heart rate and causes you to sweat) each week. In addition, most adults need muscle-strengthening exercises on 2 or more days a week.  Maintain a healthy weight. The body mass index (BMI) is a screening tool to identify possible weight problems. It provides an estimate of body fat based on height and weight. Your health care provider can find your BMI and can help you achieve or maintain a healthy weight. For adults 20 years and older:  A BMI below 18.5 is considered underweight.  A BMI of 18.5 to 24.9 is normal.  A BMI of 25 to 29.9 is considered overweight.  A BMI of 30 and above is considered obese.  Maintain normal blood lipids and cholesterol levels by exercising and minimizing your intake of saturated fat. Eat a balanced diet with plenty of fruit and vegetables. Blood tests for lipids and cholesterol should begin at age 56 and be repeated every 5 years. If your lipid or cholesterol  levels are high, you are over 50, or you are at high risk for heart disease, you may need your cholesterol levels checked more frequently. Ongoing high lipid and cholesterol levels should be treated with medicines if diet and exercise are not working.  If you smoke, find out from your health care provider how to quit. If you do not use tobacco, do not start.  Lung cancer screening is recommended for adults aged 71-80 years who are at high risk for developing lung cancer because of a history of smoking. A yearly low-dose CT scan of the lungs is recommended for people who have at least a 30-pack-year history of smoking and are a current smoker or have quit within the past 15  years. A pack year of smoking is smoking an average of 1 pack of cigarettes a day for 1 year (for example: 1 pack a day for 30 years or 2 packs a day for 15 years). Yearly screening should continue until the smoker has stopped smoking for at least 15 years. Yearly screening should be stopped for people who develop a health problem that would prevent them from having lung cancer treatment.  If you choose to drink alcohol, do not have more than 2 drinks per day. One drink is considered to be 12 ounces (355 mL) of beer, 5 ounces (148 mL) of wine, or 1.5 ounces (44 mL) of liquor.  Avoid use of street drugs. Do not share needles with anyone. Ask for help if you need support or instructions about stopping the use of drugs.  High blood pressure causes heart disease and increases the risk of stroke. Your blood pressure should be checked at least every 1-2 years. Ongoing high blood pressure should be treated with medicines, if weight loss and exercise are not effective.  If you are 36-60 years old, ask your health care provider if you should take aspirin to prevent heart disease.  Diabetes screening is done by taking a blood sample to check your blood glucose level after you have not eaten for a certain period of time (fasting). If you are not overweight and you do not have risk factors for diabetes, you should be screened once every 3 years starting at age 42. If you are overweight or obese and you are 20-59 years of age, you should be screened for diabetes every year as part of your cardiovascular risk assessment.  Colorectal cancer can be detected and often prevented. Most routine colorectal cancer screening begins at the age of 80 and continues through age 41. However, your health care provider may recommend screening at an earlier age if you have risk factors for colon cancer. On a yearly basis, your health care provider may provide home test kits to check for hidden blood in the stool. Use of a small camera  at the end of a tube to directly examine the colon (sigmoidoscopy or colonoscopy) can detect the earliest forms of colorectal cancer. Talk to your health care provider about this at age 18, when routine screening begins. Direct exam of the colon should be repeated every 5-10 years through age 86, unless early forms of precancerous polyps or small growths are found.  People who are at an increased risk for hepatitis B should be screened for this virus. You are considered at high risk for hepatitis B if:  You were born in a country where hepatitis B occurs often. Talk with your health care provider about which countries are considered high risk.  Your parents  were born in a high-risk country and you have not received a shot to protect against hepatitis B (hepatitis B vaccine).  You have HIV or AIDS.  You use needles to inject street drugs.  You live with, or have sex with, someone who has hepatitis B.  You are a man who has sex with other men (MSM).  You get hemodialysis treatment.  You take certain medicines for conditions such as cancer, organ transplantation, and autoimmune conditions.  Hepatitis C blood testing is recommended for all people born from [birth year] through 1965 and any individual with known risks for hepatitis C.  Practice safe sex. Use condoms and avoid high-risk sexual practices to reduce the spread of sexually transmitted infections (STIs). STIs include gonorrhea, chlamydia, syphilis, trichomonas, herpes, HPV, and human immunodeficiency virus (HIV). Herpes, HIV, and HPV are viral illnesses that have no cure. They can result in disability, cancer, and death.  If you are a man who has sex with other men, you should be screened at least once per year for:  HIV.  Urethral, rectal, and pharyngeal infection of gonorrhea, chlamydia, or both.  If you are at risk of being infected with HIV, it is recommended that you take a prescription medicine daily to prevent HIV infection. This  is called preexposure prophylaxis (PrEP). You are considered at risk if:  You are a man who has sex with other men (MSM) and have other risk factors.  You are a heterosexual man, are sexually active, and are at increased risk for HIV infection.  You take drugs by injection.  You are sexually active with a partner who has HIV.  Talk with your health care provider about whether you are at high risk of being infected with HIV. If you choose to begin PrEP, you should first be tested for HIV. You should then be tested every 3 months for as long as you are taking PrEP.  A one-time screening for abdominal aortic aneurysm (AAA) and surgical repair of large AAAs by ultrasound are recommended for men ages 42 to 30 years who are current or former smokers.  Healthy men should no longer receive prostate-specific antigen (PSA) blood tests as part of routine cancer screening. Talk with your health care provider about prostate cancer screening.  Testicular cancer screening is not recommended for adult males who have no symptoms. Screening includes self-exam, a health care provider exam, and other screening tests. Consult with your health care provider about any symptoms you have or any concerns you have about testicular cancer.  Use sunscreen. Apply sunscreen liberally and repeatedly throughout the day. You should seek shade when your shadow is shorter than you. Protect yourself by wearing long sleeves, pants, a wide-brimmed hat, and sunglasses year round, whenever you are outdoors.  Once a month, do a whole-body skin exam, using a mirror to look at the skin on your back. Tell your health care provider about new moles, moles that have irregular borders, moles that are larger than a pencil eraser, or moles that have changed in shape or color.  Stay current with required vaccines (immunizations).  Influenza vaccine. All adults should be immunized every year.  Tetanus, diphtheria, and acellular pertussis (Td,  Tdap) vaccine. An adult who has not previously received Tdap or who does not know his vaccine status should receive 1 dose of Tdap. This initial dose should be followed by tetanus and diphtheria toxoids (Td) booster doses every 10 years. Adults with an unknown or incomplete history of completing a 3-dose  immunization series with Td-containing vaccines should begin or complete a primary immunization series including a Tdap dose. Adults should receive a Td booster every 10 years.  Varicella vaccine. An adult without evidence of immunity to varicella should receive 2 doses or a second dose if he has previously received 1 dose.  Human papillomavirus (HPV) vaccine. Males aged 11-21 years who have not received the vaccine previously should receive the 3-dose series. Males aged 22-26 years may be immunized. Immunization is recommended through the age of 25 years for any male who has sex with males and did not get any or all doses earlier. Immunization is recommended for any person with an immunocompromised condition through the age of 50 years if he did not get any or all doses earlier. During the 3-dose series, the second dose should be obtained 4-8 weeks after the first dose. The third dose should be obtained 24 weeks after the first dose and 16 weeks after the second dose.  Zoster vaccine. One dose is recommended for adults aged 86 years or older unless certain conditions are present.  Measles, mumps, and rubella (MMR) vaccine. Adults born before [birth year] generally are considered immune to measles and mumps. Adults born in [birth year] or later should have 1 or more doses of MMR vaccine unless there is a contraindication to the vaccine or there is laboratory evidence of immunity to each of the three diseases. A routine second dose of MMR vaccine should be obtained at least 28 days after the first dose for students attending postsecondary schools, health care workers, or international travelers. People who received  inactivated measles vaccine or an unknown type of measles vaccine during 1963-1967 should receive 2 doses of MMR vaccine. People who received inactivated mumps vaccine or an unknown type of mumps vaccine before 1979 and are at high risk for mumps infection should consider immunization with 2 doses of MMR vaccine. Unvaccinated health care workers born before [birth year] who lack laboratory evidence of measles, mumps, or rubella immunity or laboratory confirmation of disease should consider measles and mumps immunization with 2 doses of MMR vaccine or rubella immunization with 1 dose of MMR vaccine.  Pneumococcal 13-valent conjugate (PCV13) vaccine. When indicated, a person who is uncertain of his immunization history and has no record of immunization should receive the PCV13 vaccine. All adults 62 years of age and older should receive this vaccine. An adult aged 77 years or older who has certain medical conditions and has not been previously immunized should receive 1 dose of PCV13 vaccine. This PCV13 should be followed with a dose of pneumococcal polysaccharide (PPSV23) vaccine. Adults who are at high risk for pneumococcal disease should obtain the PPSV23 vaccine at least 8 weeks after the dose of PCV13 vaccine. Adults older than 42 years of age who have normal immune system function should obtain the PPSV23 vaccine dose at least 1 year after the dose of PCV13 vaccine.  Pneumococcal polysaccharide (PPSV23) vaccine. When PCV13 is also indicated, PCV13 should be obtained first. All adults aged 40 years and older should be immunized. An adult younger than age 62 years who has certain medical conditions should be immunized. Any person who resides in a nursing home or long-term care facility should be immunized. An adult smoker should be immunized. People with an immunocompromised condition and certain other conditions should receive both PCV13 and PPSV23 vaccines. People with human immunodeficiency virus (HIV) infection  should be immunized as soon as possible after diagnosis. Immunization during chemotherapy or radiation therapy should  be avoided. Routine use of PPSV23 vaccine is not recommended for American Indians, [place] Natives, or people younger than 65 years unless there are medical conditions that require PPSV23 vaccine. When indicated, people who have unknown immunization and have no record of immunization should receive PPSV23 vaccine. One-time revaccination 5 years after the first dose of PPSV23 is recommended for people aged 19-64 years who have chronic kidney failure, nephrotic syndrome, asplenia, or immunocompromised conditions. People who received 1-2 doses of PPSV23 before age 66 years should receive another dose of PPSV23 vaccine at age 62 years or later if at least 5 years have passed since the previous dose. Doses of PPSV23 are not needed for people immunized with PPSV23 at or after age 31 years.  Meningococcal vaccine. Adults with asplenia or persistent complement component deficiencies should receive 2 doses of quadrivalent meningococcal conjugate (MenACWY-D) vaccine. The doses should be obtained at least 2 months apart. Microbiologists working with certain meningococcal bacteria, [place] recruits, people at risk during an outbreak, and people who travel to or live in countries with a high rate of meningitis should be immunized. A first-year college student up through age 80 years who is living in a residence hall should receive a dose if he did not receive a dose on or after his 16th birthday. Adults who have certain high-risk conditions should receive one or more doses of vaccine.  Hepatitis A vaccine. Adults who wish to be protected from this disease, have chronic liver disease, work with hepatitis A-infected animals, work in hepatitis A research labs, or travel to or work in countries with a high rate of hepatitis A should be immunized. Adults who were previously unvaccinated and who anticipate close  contact with an international adoptee during the first 60 days after arrival in the [place] from a country with a high rate of hepatitis A should be immunized.  Hepatitis B vaccine. Adults should be immunized if they wish to be protected from this disease, are under age 89 years and have diabetes, have chronic liver disease, have had more than one sex partner in the past 6 months, may be exposed to blood or other infectious body fluids, are household contacts or sex partners of hepatitis B positive people, are clients or workers in certain care facilities, or travel to or work in countries with a high rate of hepatitis B.  Haemophilus influenzae type b (Hib) vaccine. A previously unvaccinated person with asplenia or sickle cell disease or having a scheduled splenectomy should receive 1 dose of Hib vaccine. Regardless of previous immunization, a recipient of a hematopoietic stem cell transplant should receive a 3-dose series 6-12 months after his successful transplant. Hib vaccine is not recommended for adults with HIV infection. Preventive Service / Frequency Ages 42 to 29  Blood pressure check.** / Every 3-5 years.  Lipid and cholesterol check.** / Every 5 years beginning at age 64.  Hepatitis C blood test.** / For any individual with known risks for hepatitis C.  Skin self-exam. / Monthly.  Influenza vaccine. / Every year.  Tetanus, diphtheria, and acellular pertussis (Tdap, Td) vaccine.** / Consult your health care provider. 1 dose of Td every 10 years.  Varicella vaccine.** / Consult your health care provider.  HPV vaccine. / 3 doses over 6 months, if 57 or younger.  Measles, mumps, rubella (MMR) vaccine.** / You need at least 1 dose of MMR if you were born in [birth year] or later. You may also need a second dose.  Pneumococcal 13-valent conjugate (  PCV13) vaccine.** / Consult your health care provider.  Pneumococcal polysaccharide (PPSV23) vaccine.** / 1 to 2 doses if you smoke  cigarettes or if you have certain conditions.  Meningococcal vaccine.** / 1 dose if you are age 51 to 42 years and a Market researcher living in a residence hall, or have one of several medical conditions. You may also need additional booster doses.  Hepatitis A vaccine.** / Consult your health care provider.  Hepatitis B vaccine.** / Consult your health care provider.  Haemophilus influenzae type b (Hib) vaccine.** / Consult your health care provider. Ages 30 to 76  Blood pressure check.** / Every year.  Lipid and cholesterol check.** / Every 5 years beginning at age 38.  Lung cancer screening. / Every year if you are aged 92-80 years and have a 30-pack-year history of smoking and currently smoke or have quit within the past 15 years. Yearly screening is stopped once you have quit smoking for at least 15 years or develop a health problem that would prevent you from having lung cancer treatment.  Fecal occult blood test (FOBT) of stool. / Every year beginning at age 24 and continuing until age 51. You may not have to do this test if you get a colonoscopy every 10 years.  Flexible sigmoidoscopy** or colonoscopy.** / Every 5 years for a flexible sigmoidoscopy or every 10 years for a colonoscopy beginning at age 76 and continuing until age 32.  Hepatitis C blood test.** / For all people born from [birth year] through 1965 and any individual with known risks for hepatitis C.  Skin self-exam. / Monthly.  Influenza vaccine. / Every year.  Tetanus, diphtheria, and acellular pertussis (Tdap/Td) vaccine.** / Consult your health care provider. 1 dose of Td every 10 years.  Varicella vaccine.** / Consult your health care provider.  Zoster vaccine.** / 1 dose for adults aged 55 years or older.  Measles, mumps, rubella (MMR) vaccine.** / You need at least 1 dose of MMR if you were born in 1955 or later. You may also need a second dose.  Pneumococcal 13-valent conjugate (PCV13) vaccine.** /  Consult your health care provider.  Pneumococcal polysaccharide (PPSV23) vaccine.** / 1 to 2 doses if you smoke cigarettes or if you have certain conditions.  Meningococcal vaccine.** / Consult your health care provider.  Hepatitis A vaccine.** / Consult your health care provider.  Hepatitis B vaccine.** / Consult your health care provider.  Haemophilus influenzae type b (Hib) vaccine.** / Consult your health care provider. Ages 75 and over  Blood pressure check.** / Every year.  Lipid and cholesterol check.**/ Every 5 years beginning at age 62.  Lung cancer screening. / Every year if you are aged 79-80 years and have a 30-pack-year history of smoking and currently smoke or have quit within the past 15 years. Yearly screening is stopped once you have quit smoking for at least 15 years or develop a health problem that would prevent you from having lung cancer treatment.  Fecal occult blood test (FOBT) of stool. / Every year beginning at age 59 and continuing until age 75. You may not have to do this test if you get a colonoscopy every 10 years.  Flexible sigmoidoscopy** or colonoscopy.** / Every 5 years for a flexible sigmoidoscopy or every 10 years for a colonoscopy beginning at age 26 and continuing until age 30.  Hepatitis C blood test.** / For all people born from [birth year] through 1963 and any individual with known risks for hepatitis C.  Abdominal aortic aneurysm (AAA) screening.** / A one-time screening for ages 63 to 27 years who are current or former smokers.  Skin self-exam. / Monthly.  Influenza vaccine. / Every year.  Tetanus, diphtheria, and acellular pertussis (Tdap/Td) vaccine.** / 1 dose of Td every 10 years.  Varicella vaccine.** / Consult your health care provider.  Zoster vaccine.** / 1 dose for adults aged 22 years or older.  Pneumococcal 13-valent conjugate (PCV13) vaccine.** / 1 dose for all adults aged 88 years and older.  Pneumococcal polysaccharide (PPSV23)  vaccine.** / 1 dose for all adults aged 17 years and older.  Meningococcal vaccine.** / Consult your health care provider.  Hepatitis A vaccine.** / Consult your health care provider.  Hepatitis B vaccine.** / Consult your health care provider.  Haemophilus influenzae type b (Hib) vaccine.** / Consult your health care provider. **Family history and personal history of risk and conditions may change your health care provider's recommendations.   This information is not intended to replace advice given to you by your health care provider. Make sure you discuss any questions you have with your health care provider.   Document Released: 07/01/2001 Document Revised: 05/26/2014 Document Reviewed: 09/30/2010 Elsevier Interactive Patient Education 2016 [employer].   Please realize, EXERCISE IS MEDICINE!  -  American Heart Association ( AHA) guidelines for exercise : If you are in good health, without any medical conditions, you should engage in 150-300 minutes of moderate intensity aerobic activity per week.  This means you should be huffing and puffing throughout your workout.   Engaging in regular exercise will improve brain function and memory, as well as improve mood, boost immune system and help with weight management.  As well as the other, more well-known effects of exercise such as decreasing blood sugar levels, decreasing blood pressure,  and decreasing bad cholesterol levels/ increasing good cholesterol levels.     -  The AHA strongly endorses consumption of a diet that contains a variety of foods from all the food categories with an emphasis on fruits and vegetables; fat-free and low-fat dairy products; cereal and grain products; legumes and nuts; and fish, poultry, and/or extra lean meats.    Excessive food intake, especially of foods high in saturated and trans fats, sugar, and salt, should be avoided.    Adequate water intake of roughly 1/2 of your weight in pounds, should equal the  ounces of water per day you should drink.  So for instance, if you're 200 pounds, that would be 100 ounces of water per day.         Mediterranean Diet  Why follow it? Research shows. . Those who follow the Mediterranean diet have a reduced risk of heart disease  . The diet is associated with a reduced incidence of Parkinson's and Alzheimer's diseases . People following the diet may have longer life expectancies and lower rates of chronic diseases  . The Dietary Guidelines for Americans recommends the Mediterranean diet as an eating plan to promote health and prevent disease  What Is the Mediterranean Diet?  . Healthy eating plan based on typical foods and recipes of Mediterranean-style cooking . The diet is primarily a plant based diet; these foods should make up a majority of meals   Starches - Plant based foods should make up a majority of meals - They are an important sources of vitamins, minerals, energy, antioxidants, and fiber - Choose whole grains, foods high in fiber and minimally processed items  - Typical grain sources include  wheat, oats, barley, corn, brown rice, bulgar, farro, millet, polenta, couscous  - Various types of beans include chickpeas, lentils, fava beans, black beans, white beans   Fruits  Veggies - Large quantities of antioxidant rich fruits & veggies; 6 or more servings  - Vegetables can be eaten raw or lightly drizzled with oil and cooked  - Vegetables common to the traditional Mediterranean Diet include: artichokes, arugula, beets, broccoli, brussel sprouts, cabbage, carrots, celery, collard greens, cucumbers, eggplant, kale, leeks, lemons, lettuce, mushrooms, okra, onions, peas, peppers, potatoes, pumpkin, radishes, rutabaga, shallots, spinach, sweet potatoes, turnips, zucchini - Fruits common to the Mediterranean Diet include: apples, apricots, avocados, cherries, clementines, dates, figs, grapefruits, grapes, melons, nectarines, oranges, peaches, pears,  pomegranates, strawberries, tangerines  Fats - Replace butter and margarine with healthy oils, such as olive oil, canola oil, and tahini  - Limit nuts to no more than a handful a day  - Nuts include walnuts, almonds, pecans, pistachios, pine nuts  - Limit or avoid candied, honey roasted or heavily salted nuts - Olives are central to the [employer] - can be eaten whole or used in a variety of dishes   Meats Protein - Limiting red meat: no more than a few times a month - When eating red meat: choose lean cuts and keep the portion to the size of deck of cards - Eggs: approx. 0 to 4 times a week  - Fish and lean poultry: at least 2 a week  - Healthy protein sources include, chicken, [place], lean beef, lamb - Increase intake of seafood such as tuna, salmon, trout, mackerel, shrimp, scallops - Avoid or limit high fat processed meats such as sausage and bacon  Dairy - Include moderate amounts of low fat dairy products  - Focus on healthy dairy such as fat free yogurt, skim milk, low or reduced fat cheese - Limit dairy products higher in fat such as whole or 2% milk, cheese, ice cream  Alcohol - Moderate amounts of red wine is ok  - No more than 5 oz daily for women (all ages) and men older than age 75  - No more than 10 oz of wine daily for men younger than 15  Other - Limit sweets and other desserts  - Use herbs and spices instead of salt to flavor foods  - Herbs and spices common to the traditional Mediterranean Diet include: basil, bay leaves, chives, cloves, cumin, fennel, garlic, lavender, marjoram, mint, oregano, parsley, pepper, rosemary, sage, savory, sumac, tarragon, thyme   It's not just a diet, it's a lifestyle:  . The Mediterranean diet includes lifestyle factors typical of those in the region  . Foods, drinks and meals are best eaten with others and savored . Daily physical activity is important for overall good health . This could be strenuous exercise like running and  aerobics . This could also be more leisurely activities such as walking, housework, yard-work, or taking the stairs . Moderation is the key; a balanced and healthy diet accommodates most foods and drinks . Consider portion sizes and frequency of consumption of certain foods   Meal Ideas & Options:  . Breakfast:  o Whole wheat toast or whole wheat English muffins with peanut butter & hard boiled egg o Steel cut oats topped with apples & cinnamon and skim milk  o Fresh fruit: banana, strawberries, melon, berries, peaches  o Smoothies: strawberries, bananas, greek yogurt, peanut butter o Low fat greek yogurt with blueberries and granola  o Egg white  omelet with spinach and mushrooms o Breakfast couscous: whole wheat couscous, apricots, skim milk, cranberries  . Sandwiches:  o Hummus and grilled vegetables (peppers, zucchini, squash) on whole wheat bread   o Grilled chicken on whole wheat pita with lettuce, tomatoes, cucumbers or tzatziki  o Tuna salad on whole wheat bread: tuna salad made with greek yogurt, olives, red peppers, capers, green onions o Garlic rosemary lamb pita: lamb sauted with garlic, rosemary, salt & pepper; add lettuce, cucumber, greek yogurt to pita - flavor with lemon juice and black pepper  . Seafood:  o Mediterranean grilled salmon, seasoned with garlic, basil, parsley, lemon juice and black pepper o Shrimp, lemon, and spinach whole-grain pasta salad made with low fat greek yogurt  o Seared scallops with lemon orzo  o Seared tuna steaks seasoned salt, pepper, coriander topped with tomato mixture of olives, tomatoes, olive oil, minced garlic, parsley, green onions and cappers  . Meats:  o Herbed greek chicken salad with kalamata olives, cucumber, feta  o Red bell peppers stuffed with spinach, bulgur, lean ground beef (or lentils) & topped with feta   o Kebabs: skewers of chicken, tomatoes, onions, zucchini, squash  o [place] burgers: made with red onions, mint, dill,  lemon juice, feta cheese topped with roasted red peppers . Vegetarian o Cucumber salad: cucumbers, artichoke hearts, celery, red onion, feta cheese, tossed in olive oil & lemon juice  o Hummus and whole grain pita points with a greek salad (lettuce, tomato, feta, olives, cucumbers, red onion) o Lentil soup with celery, carrots made with vegetable broth, garlic, salt and pepper  o Tabouli salad: parsley, bulgur, mint, scallions, cucumbers, tomato, radishes, lemon juice, olive oil, salt and pepper.

## 2019-08-29 NOTE — Progress Notes (Signed)
Male physical  Impression and Recommendations:    1. Encounter for wellness examination   2. Health education/counseling   3. Family history of diabetes mellitus   4. Family history of hyperlipidemia   5. Family history of hypertension   6. Healthcare maintenance   7. Need for Tdap vaccination      - Patient's last OV at clinic here was 05/16/2016. - Last obtained annual physical exam through [facility] on 06/28/2018.  1) Anticipatory Guidance:  Discussed skin CA prevention and sunscreen when outside along with skin surveillance; eating a balanced and modest diet; physical activity at least 25 minutes per day or minimum of 150 min/ week moderate to intense activity.  - Advised patient to engage in self-skin screening habits twice yearly, and encouraged patient to establish with a dermatologist around age 24.  - Discussed and encouraged prudent self-testicular examination habits with patient during appointment today.  - Regarding his ED concerns, encouraged patient to return near future to discuss.  Otherwise, advised patient to engage in regular exercise, maintain control of his blood pressure and blood sugar, and "use it or lose it."   2) Immunizations / Screenings / Labs:   All immunizations are up-to-date per recommendations or will be updated today if pt allows.    - Patient understands with dental and vision screens they will schedule independently.  - Will obtain CBC, CMP, HgA1c, Lipid panel, TSH and vit D when fasting, if not already done past 12 mo/ recently   - Discussed need for TDAP vaccination today.  Administered today.  - Declines low risk Hep C/HIV screen today.  Per patient, monogamous with wife without risk of exposures.   3) Weight:   BMI meaning discussed with patient.  Discussed goal to improve diet habits to improve overall feelings of well being and objective health data. Improve nutrient density of diet through increasing intake of fruits and  vegetables and decreasing saturated fats, white flour products and refined sugars.   - Advised patient to continue working toward exercising to improve overall mental, physical, and emotional health.    - Reviewed the "spokes of the wheel" of wellbeing.  Stressed the importance of ongoing prudent habits, including regular exercise, appropriate sleep hygiene, healthful dietary habits, and prayer/meditation to relax.  - Encouraged patient to engage in daily physical activity as tolerated, especially a formal exercise routine.  Recommended that the patient eventually strive for at least 150 minutes of moderate cardiovascular activity per week according to guidelines established by the [facility].   - Healthy dietary habits encouraged, including low-carb, and high amounts of lean protein in diet.   - Patient should also consume adequate amounts of water.  - Health counseling performed.  All questions answered.   Recommendations - Discussed meeting in near future and discussing lab results from today if desired. - Patient knows to discuss sexual health, ED concerns if desired.    Orders Placed This Encounter  Procedures  . Tdap vaccine greater than or equal to 7yo IM  . CBC  . Comprehensive metabolic panel    Order Specific Question:   Has the patient fasted?    Answer:   Yes  . TSH  . T4, free  . Hemoglobin A1c  . Lipid panel    Order Specific Question:   Has the patient fasted?    Answer:   Yes  . VITAMIN D 25 Hydroxy (Vit-D Deficiency, Fractures)    No orders of the defined types  were placed in this encounter.    Return for chronic concerns, ED, near future and review of lab work if needed.  Reminded pt important of f-up preventative CPE in 1 year.  Reminded pt again, this is in addition to any chronic care visits.    Gross side effects, risk and benefits, and alternatives of medications discussed with patient.  Patient is aware that all medications have potential side effects and  we are unable to predict every side effect or drug-drug interaction that may occur.  Expresses verbal understanding and consents to current therapy plan and treatment regimen.  Please see AVS handed out to patient at the end of our visit for further patient instructions/ counseling done pertaining to today's office visit.    This case required medical decision making of at least moderate complexity.  This document serves as a record of services personally performed by Mellody Dance, DO. It was created on her behalf by Toni Amend, a trained medical scribe. The creation of this record is based on the scribe's personal observations and the provider's statements to them.    The above documentation from Toni Amend, medical scribe, has been reviewed by Marjory Sneddon, D.O.      Subjective:      I, Toni Amend, am serving as Education administrator for [employer].   CC: CPE   HPI: Dustin Stone is a 44 y.o. male who [facility] to [facility] today for a yearly health maintenance exam.    Health Maintenance Summary  - Reviewed and updated, unless pt declines services.  Last Cologuard or Colonoscopy:  Start at age 3. Family history of Colon CA:  No history of colon cancer in first degree relatives.  Reports that he has had at least one second-degree relative that had colon cancer, but not two.  Tobacco History Reviewed:  Never smoker.  Alcohol / drug use:    No concerns, no excessive use / no use Exercise Habits:  He tries to walk daily.  He and his wife have been working on weight loss during [place].  Notes he has lost about 10 lbs, and his wife has lost about 50+ lbs. Dental Home:  Follows up with dentist about every six months. Eye exams:  Feels his eyesight is okay, and has not obtained regular eye exams. Dermatology home:  Denies family history of skin cancer.  Went a year and a half ago to obtain treatment for a "red rash when I got  hot and sweaty."  He wears sunscreen if he goes to the beach, but not normally while doing yard work at home.  Male history: STD concerns:  None, monogamous with wife. Birth control method:   n/a Additional penile/ urinary concerns:  Feels in terms of sexual health, "it's not lasting as long, and not even coming back the second time."    Denies family history of prostate cancer.  Notes sometimes when he finishes urinating, he feels like he's not quite done, but nothing else comes out after.  States that since he's cut out sweet tea over the summer, "it's changed 100%."  States since this change, he is not getting up throughout the night to urinate anymore.   Additional concerns beyond Health Maintenance issues:     Thought he had sciatic nerve pain last year, which has happened probably four times since he noticed onset, but the pain alleviates shortly after occurring.    Immunization History  Administered Date(s) Administered  .  Influenza,inj,Quad PF,6+ Mos 06/28/2018  . Influenza-Unspecified 06/28/2018  . Tdap 08/29/2019     Health Maintenance  Topic Date Due  . INFLUENZA VACCINE  12/18/2019  . TETANUS/TDAP  08/28/2029  . HIV Screening  Completed       Wt Readings from Last 3 Encounters:  08/29/19 174 lb 8 oz (79.2 kg)  06/24/17 177 lb 4.8 oz (80.4 kg)  05/06/16 174 lb (78.9 kg)   BP Readings from Last 3 Encounters:  08/29/19 131/81  06/24/17 108/68  05/06/16 121/78   Pulse Readings from Last 3 Encounters:  08/29/19 62  06/24/17 88    Patient Active Problem List   Diagnosis Date Noted  . Flu-like symptoms 06/24/2017  . Nausea 06/24/2017  . Irritable bowel- with wrong foods- Diarh 05/06/2016  . Family history of diabetes mellitus 05/06/2016  . Family history of coronary artery disease 05/06/2016  . Family history of hyperlipidemia 05/06/2016  . Family history of hypertension 05/06/2016  . Family history of colonic polyps- all benign 05/06/2016    History  reviewed. No pertinent past medical history.  Past Surgical History:  Procedure Laterality Date  . WISDOM TOOTH EXTRACTION      Family History  Problem Relation Age of Onset  . Heart attack Mother   . Diabetes Mother   . Heart attack Father   . Diabetes Father   . Hypertension Father   . Hyperlipidemia Father   . Multiple sclerosis Sister   . Healthy Brother   . Cancer Maternal Grandmother        breast  . Heart attack Maternal Grandmother   . Heart attack Paternal Grandfather   . Healthy Brother   . Healthy Brother     Social History   Substance and Sexual Activity  Drug Use No  ,  Social History   Substance and Sexual Activity  Alcohol Use No  ,  Social History   Tobacco Use  Smoking Status Never Smoker  Smokeless Tobacco Never Used  ,  Social History   Substance and Sexual Activity  Sexual Activity Yes  . Birth control/protection: I.U.D.    Patient's Medications  New Prescriptions   No medications on file  Previous Medications   ASCORBIC ACID (VITAMIN C) 1000 MG TABLET    Take 1,000 mg by mouth daily.   CHOLECALCIFEROL (VITAMIN D3 ADULT GUMMIES PO)    Take by mouth. 400 mg QD   PROBIOTIC PRODUCT (PROBIOTIC-10 PO)    Take by mouth.  Modified Medications   No medications on file  Discontinued Medications   ONDANSETRON (ZOFRAN-ODT) 4 MG DISINTEGRATING TABLET    Take 1 tablet (4 mg total) by mouth every 8 (eight) hours as needed for nausea or vomiting.    Patient has no known allergies.  Review of Systems: General:   Denies fever, chills, unexplained weight loss.  Optho/Auditory:   Denies visual changes, blurred vision/LOV Respiratory:   Denies SOB, DOE more than baseline levels.   Cardiovascular:   Denies chest pain, palpitations, new onset peripheral edema  Gastrointestinal:   Denies nausea, vomiting, diarrhea.  Genitourinary: Denies dysuria, freq/ urgency, flank pain or discharge from genitals.  Endocrine:     Denies hot or cold intolerance,  polyuria, polydipsia. Musculoskeletal:   Denies unexplained myalgias, joint swelling, unexplained arthralgias, gait problems.  Skin:  Denies rash, suspicious lesions Neurological:     Denies dizziness, unexplained weakness, numbness  Psychiatric/Behavioral:   Denies mood changes, suicidal or homicidal ideations, hallucinations    Objective:  Blood pressure 131/81, pulse 62, temperature 98.2 F (36.8 C), temperature source Oral, resp. rate 12, height 5\' 11"  (1.803 m), weight 174 lb 8 oz (79.2 kg), SpO2 97 %. Body mass index is 24.34 kg/m. General Appearance:    Alert, cooperative, no distress, appears stated age  Head:    Normocephalic, without obvious abnormality, atraumatic  Eyes:    PERRL, conjunctiva/corneas clear, EOM's intact, fundi    benign, both eyes  Ears:    Normal TM's and external ear canals, both ears  Nose:   Nares normal, septum midline, mucosa normal, no drainage    or sinus tenderness  Throat:   Lips w/o lesion, mucosa moist, and tongue normal; teeth and   gums normal  Neck:   Supple, symmetrical, trachea midline, no adenopathy;    thyroid:  no enlargement/tenderness/nodules; no carotid   bruit or JVD  Back:     Symmetric, no curvature, ROM normal, no CVA tenderness  Lungs:     Clear to auscultation bilaterally, respirations unlabored, no       Wh/ R/ R  Chest Wall:    No tenderness or gross deformity; normal excursion   Heart:    Regular rate and rhythm, S1 and S2 normal, no murmur, rub   or gallop  Abdomen:     Soft, non-tender, bowel sounds active all four quadrants, NO   G/R/R, no masses, no organomegaly  Genitalia:    Ext genitalia: without lesion, no penile rash or discharge, no hernias appreciated   Rectal:    Normal tone, prostate WNL's and equal b/l, no tenderness; guaiac negative stool  Extremities:   Extremities normal, atraumatic, no cyanosis or gross edema  Pulses:   2+ and symmetric all extremities  Skin:   Warm, dry, Skin color, texture, turgor  normal, no obvious rashes or lesions  M-Sk:   Ambulates * 4 w/o difficulty, no gross deformities, tone WNL  Neurologic:   CNII-XII intact, normal strength, sensation and reflexes    Throughout Psych:  No HI/SI, judgement and insight good, Euthymic mood. Full Affect.

## 2019-08-30 LAB — CBC
Hematocrit: 44.8 % (ref 37.5–51.0)
Hemoglobin: 15.4 g/dL (ref 13.0–17.7)
MCH: 30.9 pg (ref 26.6–33.0)
MCHC: 34.4 g/dL (ref 31.5–35.7)
MCV: 90 fL (ref 79–97)
Platelets: 206 10*3/uL (ref 150–450)
RBC: 4.98 x10E6/uL (ref 4.14–5.80)
RDW: 12.5 % (ref 11.6–15.4)
WBC: 6.6 10*3/uL (ref 3.4–10.8)

## 2019-08-30 LAB — COMPREHENSIVE METABOLIC PANEL
ALT: 35 IU/L (ref 0–44)
AST: 22 IU/L (ref 0–40)
Albumin/Globulin Ratio: 1.9 (ref 1.2–2.2)
Albumin: 4.2 g/dL (ref 4.0–5.0)
Alkaline Phosphatase: 43 IU/L (ref 39–117)
BUN/Creatinine Ratio: 12 (ref 9–20)
BUN: 12 mg/dL (ref 6–24)
Bilirubin Total: 0.5 mg/dL (ref 0.0–1.2)
CO2: 24 mmol/L (ref 20–29)
Calcium: 8.9 mg/dL (ref 8.7–10.2)
Chloride: 104 mmol/L (ref 96–106)
Creatinine, Ser: 1.03 mg/dL (ref 0.76–1.27)
GFR calc Af Amer: 104 mL/min/{1.73_m2} (ref >59)
GFR calc non Af Amer: 90 mL/min/{1.73_m2} (ref >59)
Globulin, Total: 2.2 g/dL (ref 1.5–4.5)
Glucose: 96 mg/dL (ref 65–99)
Potassium: 4.3 mmol/L (ref 3.5–5.2)
Sodium: 141 mmol/L (ref 134–144)
Total Protein: 6.4 g/dL (ref 6.0–8.5)

## 2019-08-30 LAB — LIPID PANEL
Chol/HDL Ratio: 3 ratio (ref 0.0–5.0)
Cholesterol, Total: 146 mg/dL (ref 100–199)
HDL: 48 mg/dL (ref >39)
LDL Chol Calc (NIH): 89 mg/dL (ref 0–99)
Triglycerides: 36 mg/dL (ref 0–149)
VLDL Cholesterol Cal: 9 mg/dL (ref 5–40)

## 2019-08-30 LAB — T4, FREE: Free T4: 1.2 ng/dL (ref 0.82–1.77)

## 2019-08-30 LAB — VITAMIN D 25 HYDROXY (VIT D DEFICIENCY, FRACTURES): Vit D, 25-Hydroxy: 33.8 ng/mL (ref 30.0–100.0)

## 2019-08-30 LAB — HEMOGLOBIN A1C
Est. average glucose Bld gHb Est-mCnc: 114 mg/dL
Hgb A1c MFr Bld: 5.6 % (ref 4.8–5.6)

## 2019-08-30 LAB — TSH: TSH: 1.79 u[IU]/mL (ref 0.450–4.500)

## 2019-09-25 NOTE — Progress Notes (Signed)
Established Patient Office Visit  Subjective:  Patient ID: Dustin Stone, male    DOB: 24-Apr-1976  Age: 44 y.o. MRN: [id number]  CC:  Chief Complaint  Patient presents with  . Erectile Dysfunction  . Sciatica    HPI Dustin Stone presents for chronic sciatica and erectile dysfunction concerns. Pt states he has had intermittent sciatic nerve pain (low back) that radiates down his leg for the past year. Reports he is able to manage his flare-ups with stretching exercises, spinal decompression, and Aleve. He is currently recovering from one. Denies numbness, tingling, muscle weakness, urinary retention, fever, or night sweats. He also has concerns of premature ejaculation for the past 6-7 months. Reports inability to develop second erection. Denies decreased libido, new medications, anxiety or stress, excessive use of Aleve or other antiinflammatories, has never smoked, and has no PMHx of cardiovascular disease. Pt still has morning erections.   History reviewed. No pertinent past medical history.  Past Surgical History:  Procedure Laterality Date  . WISDOM TOOTH EXTRACTION      Family History  Problem Relation Age of Onset  . Heart attack Mother   . Diabetes Mother   . Heart attack Father   . Diabetes Father   . Hypertension Father   . Hyperlipidemia Father   . Multiple sclerosis Sister   . Healthy Brother   . Cancer Maternal Grandmother        breast  . Heart attack Maternal Grandmother   . Heart attack Paternal Grandfather   . Healthy Brother   . Healthy Brother     Social History   Socioeconomic History  . Marital status: Married    Spouse name: Not on file  . Number of children: Not on file  . Years of education: Not on file  . Highest education level: Not on file  Occupational History  . Not on file  Tobacco Use  . Smoking status: Never Smoker  . Smokeless tobacco: Never Used  Substance and Sexual Activity  . Alcohol use: No  . Drug use: No  .  Sexual activity: Yes    Birth control/protection: I.U.D.  Other Topics Concern  . Not on file  Social History Narrative  . Not on file   Social Determinants of Health   Financial Resource Strain:   . Difficulty of Paying Living Expenses:   Food Insecurity:   . Worried About Charity fundraiser in the Last Year:   . Arboriculturist in the Last Year:   Transportation Needs:   . Film/video editor (Medical):   Marland Kitchen Lack of Transportation (Non-Medical):   Physical Activity:   . Days of Exercise per Week:   . Minutes of Exercise per Session:   Stress:   . Feeling of Stress :   Social Connections:   . Frequency of Communication with Friends and Family:   . Frequency of Social Gatherings with Friends and Family:   . Attends Religious Services:   . Active Member of Clubs or Organizations:   . Attends Archivist Meetings:   Marland Kitchen Marital Status:   Intimate Partner Violence:   . Fear of Current or Ex-Partner:   . Emotionally Abused:   Marland Kitchen Physically Abused:   . Sexually Abused:     Outpatient Medications Prior to Visit  Medication Sig Dispense Refill  . Ascorbic Acid (VITAMIN C) 1000 MG tablet Take 1,000 mg by mouth daily.    . Cholecalciferol (VITAMIN D3 ADULT GUMMIES PO)  Take 2,000 Units by mouth. 400 mg QD     . Probiotic Product (PROBIOTIC-10 PO) Take by mouth.     No facility-administered medications prior to visit.    No Known Allergies  ROS Review of Systems:  A fourteen system review of systems was performed and found to be positive as per HPI.   Objective:    Physical Exam General:  Well Developed, well nourished, appropriate for stated age.  Neuro:  Alert and oriented,  extra-ocular muscles intact  HEENT:  Normocephalic, atraumatic, neck supple, no carotid bruits appreciated  Skin:  no gross rash, warm, pink. Cardiac:  RRR, S1 S2 Respiratory:  ECTA B/L and A/P, Not using accessory muscles, speaking in full  sentences- unlabored. Genitalia:  Declined. Vascular:  Ext warm, no cyanosis apprec.; cap RF less 2 sec. Psych:  No HI/SI, judgement and insight good, Euthymic mood. Full Affect.    BP 127/77   Pulse 60   Temp 97.7 F (36.5 C) (Oral)   Ht 5\' 10"  (1.778 m)   Wt 176 lb 3.2 oz (79.9 kg)   SpO2 96%   BMI 25.28 kg/m  Wt Readings from Last 3 Encounters:  09/26/19 176 lb 3.2 oz (79.9 kg)  08/29/19 174 lb 8 oz (79.2 kg)  06/24/17 177 lb 4.8 oz (80.4 kg)     There are no preventive care reminders to display for this patient.  There are no preventive care reminders to display for this patient.  Lab Results  Component Value Date   TSH 1.790 08/29/2019   Lab Results  Component Value Date   WBC 6.6 08/29/2019   HGB 15.4 08/29/2019   HCT 44.8 08/29/2019   MCV 90 08/29/2019   PLT 206 08/29/2019   Lab Results  Component Value Date   NA 141 08/29/2019   K 4.3 08/29/2019   CO2 24 08/29/2019   GLUCOSE 96 08/29/2019   BUN 12 08/29/2019   CREATININE 1.03 08/29/2019   BILITOT 0.5 08/29/2019   ALKPHOS 43 08/29/2019   AST 22 08/29/2019   ALT 35 08/29/2019   PROT 6.4 08/29/2019   ALBUMIN 4.2 08/29/2019   CALCIUM 8.9 08/29/2019   Lab Results  Component Value Date   CHOL 146 08/29/2019   Lab Results  Component Value Date   HDL 48 08/29/2019   Lab Results  Component Value Date   LDLCALC 89 08/29/2019   Lab Results  Component Value Date   TRIG 36 08/29/2019   Lab Results  Component Value Date   CHOLHDL 3.0 08/29/2019   Lab Results  Component Value Date   HGBA1C 5.6 08/29/2019      Assessment & Plan:   Problem List Items Addressed This Visit    None    Visit Diagnoses    Erectile dysfunction, unspecified erectile dysfunction type    -  Primary   Relevant Orders   Testosterone,Free and Total   PSA   Lumbar radiculopathy, chronic         ED: - Pt's recent blood work (cbc, cmp, thyroid pain, A1c, lipid panel) were wnl's, PHQ9 score 0, never a smoker and no substance abuse so will place  order to check testosterone and PSA for further evaluation. - Pt's SHIM score 12 indicating mild-moderate ED. Pending lab results will consider starting Sildenafil 50 mg. Pt has low risk for cardiovascular disease. - If medication started, recommend follow-up in 3 months to reassess symptoms and medication therapy. If symptoms fail to improve or worsen will consider  referral to urologist.   Lumbar radiculopathy: - Advise to continue anti-inflammatories as needed for pain, activity modification, gentle stretches and spinal decompression.  - Pt declined referral to neurosurgery at this time given symptoms are manageable with conservative therapy. Will consider referral if symptoms worsen or become more persistent.    No orders of the defined types were placed in this encounter.   Follow-up: Return if symptoms worsen or fail to improve, for lab only early in am (testosterone, psa).    Mayer Masker, PA-C

## 2019-09-26 ENCOUNTER — Other Ambulatory Visit: Payer: Self-pay

## 2019-09-26 ENCOUNTER — Ambulatory Visit (INDEPENDENT_AMBULATORY_CARE_PROVIDER_SITE_OTHER): Payer: 59 | Admitting: Physician Assistant

## 2019-09-26 ENCOUNTER — Encounter: Payer: Self-pay | Admitting: Physician Assistant

## 2019-09-26 VITALS — BP 127/77 | HR 60 | Temp 97.7°F | Ht 70.0 in | Wt 176.2 lb

## 2019-09-26 DIAGNOSIS — M5416 Radiculopathy, lumbar region: Secondary | ICD-10-CM | POA: Diagnosis not present

## 2019-09-26 DIAGNOSIS — N529 Male erectile dysfunction, unspecified: Secondary | ICD-10-CM

## 2019-09-26 NOTE — Patient Instructions (Signed)
Sciatica  Sciatica is pain, numbness, weakness, or tingling along the path of the sciatic nerve. The sciatic nerve starts in the lower back and runs down the back of each leg. The nerve controls the muscles in the lower leg and in the back of the knee. It also provides feeling (sensation) to the back of the thigh, the lower leg, and the sole of the foot. Sciatica is a symptom of another medical condition that pinches or puts pressure on the sciatic nerve. Sciatica most often only affects one side of the body. Sciatica usually goes away on its own or with treatment. In some cases, sciatica may come back (recur). What are the causes? This condition is caused by pressure on the sciatic nerve or pinching of the nerve. This may be the result of:  A disk in between the bones of the spine bulging out too far (herniated disk).  Age-related changes in the spinal disks.  A pain disorder that affects a muscle in the buttock.  Extra bone growth near the sciatic nerve.  A break (fracture) of the pelvis.  Pregnancy.  Tumor. This is rare. What increases the risk? The following factors may make you more likely to develop this condition:  Playing sports that place pressure or stress on the spine.  Having poor strength and flexibility.  A history of back injury or surgery.  Sitting for long periods of time.  Doing activities that involve repetitive bending or lifting.  Obesity. What are the signs or symptoms? Symptoms can vary from mild to very severe, and they may include:  Any of these problems in the lower back, leg, hip, or buttock: ? Mild tingling, numbness, or dull aches. ? Burning sensations. ? Sharp pains.  Numbness in the back of the calf or the sole of the foot.  Leg weakness.  Severe back pain that makes movement difficult. Symptoms may get worse when you cough, sneeze, or laugh, or when you sit or stand for long periods of time. How is this diagnosed? This condition may be  diagnosed based on:  Your symptoms and medical history.  A physical exam.  Blood tests.  Imaging tests, such as: ? X-rays. ? MRI. ? CT scan. How is this treated? In many cases, this condition improves on its own without treatment. However, treatment may include:  Reducing or modifying physical activity.  Exercising and stretching.  Icing and applying heat to the affected area.  Medicines that help to: ? Relieve pain and swelling. ? Relax your muscles.  Injections of medicines that help to relieve pain, irritation, and inflammation around the sciatic nerve (steroids).  Surgery. Follow these instructions at home: Medicines  Take over-the-counter and prescription medicines only as told by your health care provider.  Ask your health care provider if the medicine prescribed to you: ? Requires you to avoid driving or using heavy machinery. ? Can cause constipation. You may need to take these actions to prevent or treat constipation:  Drink enough fluid to keep your urine pale yellow.  Take over-the-counter or prescription medicines.  Eat foods that are high in fiber, such as beans, whole grains, and fresh fruits and vegetables.  Limit foods that are high in fat and processed sugars, such as fried or sweet foods. Managing pain      If directed, put ice on the affected area. ? Put ice in a plastic bag. ? Place a towel between your skin and the bag. ? Leave the ice on for 20 minutes,   2-3 times a day.  If directed, apply heat to the affected area. Use the heat source that your health care provider recommends, such as a moist heat pack or a heating pad. ? Place a towel between your skin and the heat source. ? Leave the heat on for 20-30 minutes. ? Remove the heat if your skin turns bright red. This is especially important if you are unable to feel pain, heat, or cold. You may have a greater risk of getting burned. Activity   Return to your normal activities as told  by your health care provider. Ask your health care provider what activities are safe for you.  Avoid activities that make your symptoms worse.  Take brief periods of rest throughout the day. ? When you rest for longer periods, mix in some mild activity or stretching between periods of rest. This will help to prevent stiffness and pain. ? Avoid sitting for long periods of time without moving. Get up and move around at least one time each hour.  Exercise and stretch regularly, as told by your health care provider.  Do not lift anything that is heavier than 10 lb (4.5 kg) while you have symptoms of sciatica. When you do not have symptoms, you should still avoid heavy lifting, especially repetitive heavy lifting.  When you lift objects, always use proper lifting technique, which includes: ? Bending your knees. ? Keeping the load close to your body. ? Avoiding twisting. General instructions  Maintain a healthy weight. Excess weight puts extra stress on your back.  Wear supportive, comfortable shoes. Avoid wearing high heels.  Avoid sleeping on a mattress that is too soft or too hard. A mattress that is firm enough to support your back when you sleep may help to reduce your pain.  Keep all follow-up visits as told by your health care provider. This is important. Contact a health care provider if:  You have pain that: ? Wakes you up when you are sleeping. ? Gets worse when you lie down. ? Is worse than you have experienced in the past. ? Lasts longer than 4 weeks.  You have an unexplained weight loss. Get help right away if:  You are not able to control when you urinate or have bowel movements (incontinence).  You have: ? Weakness in your lower back, pelvis, buttocks, or legs that gets worse. ? Redness or swelling of your back. ? A burning sensation when you urinate. Summary  Sciatica is pain, numbness, weakness, or tingling along the path of the sciatic nerve.  This condition  is caused by pressure on the sciatic nerve or pinching of the nerve.  Sciatica can cause pain, numbness, or tingling in the lower back, legs, hips, and buttocks.  Treatment often includes rest, exercise, medicines, and applying ice or heat. This information is not intended to replace advice given to you by your health care provider. Make sure you discuss any questions you have with your health care provider. Document Revised: 05/24/2018 Document Reviewed: 05/24/2018 Elsevier Patient Education  [employer].   Erectile Dysfunction Erectile dysfunction (ED) is the inability to get or keep an erection in order to have sexual intercourse. Erectile dysfunction may include:  Inability to get an erection.  Lack of enough hardness of the erection to allow penetration.  Loss of the erection before sex is finished. What are the causes? This condition may be caused by:  Certain medicines, such as: ? Pain relievers. ? Antihistamines. ? Antidepressants. ? Blood pressure  medicines. ? Water pills (diuretics). ? Ulcer medicines. ? Muscle relaxants. ? Drugs.  Excessive drinking.  Psychological causes, such as: ? Anxiety. ? Depression. ? Sadness. ? Exhaustion. ? Performance fear. ? Stress.  Physical causes, such as: ? Artery problems. This may include diabetes, smoking, liver disease, or atherosclerosis. ? High blood pressure. ? Hormonal problems, such as low testosterone. ? Obesity. ? Nerve problems. This may include back or pelvic injuries, diabetes mellitus, multiple sclerosis, or Parkinson disease. What are the signs or symptoms? Symptoms of this condition include:  Inability to get an erection.  Lack of enough hardness of the erection to allow penetration.  Loss of the erection before sex is finished.  Normal erections at some times, but with frequent unsatisfactory episodes.  Low sexual satisfaction in either partner due to erection problems.  A curved penis  occurring with erection. The curve may cause pain or the penis may be too curved to allow for intercourse.  Never having nighttime erections. How is this diagnosed? This condition is often diagnosed by:  Performing a physical exam to find other diseases or specific problems with the penis.  Asking you detailed questions about the problem.  Performing blood tests to check for diabetes mellitus or to measure hormone levels.  Performing other tests to check for underlying health conditions.  Performing an ultrasound exam to check for scarring.  Performing a test to check blood flow to the penis.  Doing a sleep study at home to measure nighttime erections. How is this treated? This condition may be treated by:  Medicine taken by mouth to help you achieve an erection (oral medicine).  Hormone replacement therapy to replace low testosterone levels.  Medicine that is injected into the penis. Your health care provider may instruct you how to give yourself these injections at home.  Vacuum pump. This is a pump with a ring on it. The pump and ring are placed on the penis and used to create pressure that helps the penis become erect.  Penile implant surgery. In this procedure, you may receive: ? An inflatable implant. This consists of cylinders, a pump, and a reservoir. The cylinders can be inflated with a fluid that helps to create an erection, and they can be deflated after intercourse. ? A semi-rigid implant. This consists of two silicone rubber rods. The rods provide some rigidity. They are also flexible, so the penis can both curve downward in its normal position and become straight for sexual intercourse.  Blood vessel surgery, to improve blood flow to the penis. During this procedure, a blood vessel from a different part of the body is placed into the penis to allow blood to flow around (bypass) damaged or blocked blood vessels.  Lifestyle changes, such as exercising more, losing  weight, and quitting smoking. Follow these instructions at home: Medicines   Take over-the-counter and prescription medicines only as told by your health care provider. Do not increase the dosage without first discussing it with your health care provider.  If you are using self-injections, perform injections as directed by your health care provider. Make sure to avoid any veins that are on the surface of the penis. After giving an injection, apply pressure to the injection site for 5 minutes. General instructions  Exercise regularly, as directed by your health care provider. Work with your health care provider to lose weight, if needed.  Do not use any products that contain nicotine or tobacco, such as cigarettes and e-cigarettes. If you need help quitting,  ask your health care provider.  Before using a vacuum pump, read the instructions that come with the pump and discuss any questions with your health care provider.  Keep all follow-up visits as told by your health care provider. This is important. Contact a health care provider if:  You feel nauseous.  You vomit. Get help right away if:  You are taking oral or injectable medicines and you have an erection that lasts longer than 4 hours. If your health care provider is unavailable, go to the nearest emergency room for evaluation. An erection that lasts much longer than 4 hours can result in permanent damage to your penis.  You have severe pain in your groin or abdomen.  You develop redness or severe swelling of your penis.  You have redness spreading up into your groin or lower abdomen.  You are unable to urinate.  You experience chest pain or a rapid heart beat (palpitations) after taking oral medicines. Summary  Erectile dysfunction (ED) is the inability to get or keep an erection during sexual intercourse. This problem can usually be treated successfully.  This condition is diagnosed based on a physical exam, your  symptoms, and tests to determine the cause. Treatment varies depending on the cause, and may include medicines, hormone therapy, surgery, or vacuum pump.  You may need follow-up visits to make sure that you are using your medicines or devices correctly.  Get help right away if you are taking or injecting medicines and you have an erection that lasts longer than 4 hours. This information is not intended to replace advice given to you by your health care provider. Make sure you discuss any questions you have with your health care provider. Document Revised: 04/17/2017 Document Reviewed: 05/21/2016 Elsevier Patient Education  2020 [employer].

## 2019-09-27 ENCOUNTER — Other Ambulatory Visit: Payer: 59

## 2019-09-27 DIAGNOSIS — N529 Male erectile dysfunction, unspecified: Secondary | ICD-10-CM

## 2019-09-28 LAB — PSA: Prostate Specific Ag, Serum: 0.4 ng/mL (ref 0.0–4.0)

## 2019-09-28 LAB — TESTOSTERONE: Testosterone: 313 ng/dL (ref 264–916)

## 2019-09-29 ENCOUNTER — Telehealth: Payer: Self-pay | Admitting: Physician Assistant

## 2019-09-29 MED ORDER — SILDENAFIL CITRATE 50 MG PO TABS: 50.0000 mg | ORAL_TABLET | Freq: Every day | ORAL | 0 refills | Status: DC | PRN

## 2019-09-29 NOTE — Telephone Encounter (Signed)
-----   Message from Mayer Masker, [place] sent at 09/29/2019 11:06 AM EDT ----- Peri Jefferson Morning Averil Digman,  Please call patient and notify lab results are normal. If patient is agreeable, I can send in rx for Viagra 50 mg to take 1 hr before sexual activity. I would recommend to follow-up in 3 months to reassess symptoms and medication.   Thank you, Kandis Cocking

## 2020-01-30 ENCOUNTER — Other Ambulatory Visit: Payer: Self-pay | Admitting: Physician Assistant

## 2020-01-31 MED ORDER — SILDENAFIL CITRATE 50 MG PO TABS: 50.0000 mg | ORAL_TABLET | Freq: Every day | ORAL | 0 refills | Status: DC | PRN

## 2020-02-15 ENCOUNTER — Other Ambulatory Visit: Payer: 59

## 2020-02-15 DIAGNOSIS — Z20822 Contact with and (suspected) exposure to covid-19: Secondary | ICD-10-CM

## 2020-02-16 LAB — SARS-COV-2, NAA 2 DAY TAT

## 2020-02-16 LAB — NOVEL CORONAVIRUS, NAA: SARS-CoV-2, NAA: NOT DETECTED

## 2020-03-22 ENCOUNTER — Ambulatory Visit: Payer: Self-pay

## 2020-03-23 ENCOUNTER — Ambulatory Visit: Payer: 59 | Admitting: Physician Assistant

## 2020-03-23 ENCOUNTER — Other Ambulatory Visit: Payer: Self-pay

## 2022-04-18 ENCOUNTER — Ambulatory Visit: Payer: Self-pay

## 2022-06-10 ENCOUNTER — Ambulatory Visit
Admission: RE | Admit: 2022-06-10 | Discharge: 2022-06-10 | Disposition: A | Discharge status: Home / Self Care | Payer: 59 | Source: Ambulatory Visit | Attending: Physician Assistant | Admitting: Physician Assistant

## 2022-06-10 VITALS — BP 129/84 | HR 69 | Temp 98.0°F | Resp 18 | Ht 70.0 in | Wt 185.0 lb

## 2022-06-10 DIAGNOSIS — R051 Acute cough: Secondary | ICD-10-CM | POA: Diagnosis not present

## 2022-06-10 DIAGNOSIS — J069 Acute upper respiratory infection, unspecified: Secondary | ICD-10-CM | POA: Diagnosis not present

## 2022-06-10 NOTE — Discharge Instructions (Signed)
Advised to continue to use DayQuil and NyQuil to help control the cough and congestion. Advised take ibuprofen or Motrin as needed for fever, body aches and pain.  Advised follow-up PCP or return to urgent care if symptoms fail to improve over the next 5 to 7 days.

## 2022-06-10 NOTE — ED Provider Notes (Signed)
[name]    CSN: [id number] Arrival date & time: 06/10/22  1428      History   Chief Complaint Chief Complaint  Patient presents with   Cough    Cpugh, congestion,  headache - Entered by patient    HPI Dustin Stone is a 47 y.o. male.   -year-old male presents with cough and congestion.  Patient indicates that he recently went to a funeral and was around a lot of different individuals and later found out that several had flu, COVID, and RSV.  Patient indicates for the past 2 days he has been had some upper respiratory congestion with sinus congestion, postnasal drip, rhinitis which has been clear to light green.  He also indicates he has had some chest congestion with intermittent cough, indicating that cough is worse at night.  He indicates that the congestion has been yellow to green.  Patient indicates that he has been taking some DayQuil and NyQuil with some improvement of his symptoms.  He denies having any fever, chills, wheezing, shortness of breath, body aches or pain.  Indicates that he did do a COVID test yesterday OTC and results were negative.  He does not smoke.   Cough   History reviewed. No pertinent past medical history.  Patient Active Problem List   Diagnosis Date Noted   Flu-like symptoms 06/24/2017   Nausea 06/24/2017   Irritable bowel- with wrong foods- Diarh 05/06/2016   Family history of diabetes mellitus 05/06/2016   Family history of coronary artery disease 05/06/2016   Family history of hyperlipidemia 05/06/2016   Family history of hypertension 05/06/2016   Family history of colonic polyps- all benign 05/06/2016    Past Surgical History:  Procedure Laterality Date   WISDOM TOOTH EXTRACTION         Home Medications    Prior to Admission medications   Medication Sig Start Date End Date Taking? Authorizing Provider  Cholecalciferol (VITAMIN D3 ADULT GUMMIES PO) Take 2,000 Units by mouth. 400 mg QD    Yes [provider]  Probiotic Product (PROBIOTIC-10 PO) Take by mouth.   Yes [provider]  Ascorbic Acid (VITAMIN C) 1000 MG tablet Take 1,000 mg by mouth daily.    [provider]  sildenafil (VIAGRA) 50 MG tablet Take 1 tablet (50 mg total) by mouth daily as needed for erectile dysfunction. 01/31/20   Mayer Masker, PA-C    Family History Family History  Problem Relation Age of Onset   Heart attack Mother    Diabetes Mother    Heart attack Father    Diabetes Father    Hypertension Father    Hyperlipidemia Father    Multiple sclerosis Sister    Healthy Brother    Cancer Maternal Grandmother        breast   Heart attack Maternal Grandmother    Heart attack Paternal Grandfather    Healthy Brother    Healthy Brother     Social History Social History   Tobacco Use   Smoking status: Never   Smokeless tobacco: Never  Substance Use Topics   Alcohol use: No   Drug use: No     Allergies   Patient has no known allergies.   Review of Systems Review of Systems  HENT:  Positive for postnasal drip, sinus pressure and sinus pain.   Respiratory:  Positive for cough.      Physical Exam Triage Vital Signs ED Triage Vitals  Enc Vitals Group  BP 06/10/22 1435 129/84     Pulse Rate 06/10/22 1435 69     Resp 06/10/22 1435 18     Temp 06/10/22 1435 98 F (36.7 C)     Temp Source 06/10/22 1435 Oral     SpO2 06/10/22 1435 95 %     Weight 06/10/22 1434 185 lb (83.9 kg)     Height 06/10/22 1434 5\' 10"  (1.778 m)     Head Circumference --      Peak Flow --      Pain Score 06/10/22 1434 0     Pain Loc --      Pain Edu? --      Excl. in Paint? --    No data found.  Updated Vital Signs BP 129/84 (BP Location: Left Arm)   Pulse 69   Temp 98 F (36.7 C) (Oral)   Resp 18   Ht 5\' 10"  (1.778 m)   Wt 185 lb (83.9 kg)   SpO2 95%   BMI 26.54 kg/m   Visual Acuity Right Eye Distance:   Left Eye Distance:   Bilateral Distance:    Right Eye Near:   Left  Eye Near:    Bilateral Near:     Physical Exam Constitutional:      Appearance: Normal appearance.  HENT:     Right Ear: Tympanic membrane and ear canal normal.     Left Ear: Tympanic membrane and ear canal normal.     Mouth/Throat:     Mouth: Mucous membranes are moist.     Pharynx: Oropharynx is clear.  Cardiovascular:     Rate and Rhythm: Normal rate and regular rhythm.     Heart sounds: Normal heart sounds.  Pulmonary:     Effort: Pulmonary effort is normal.     Breath sounds: Normal breath sounds and air entry. No wheezing, rhonchi or rales.  Lymphadenopathy:     Cervical: No cervical adenopathy.  Neurological:     Mental Status: He is alert.      UC Treatments / Results  Labs (all labs ordered are listed, but only abnormal results are displayed) Labs Reviewed - No data to display  EKG   Radiology No results found.  Procedures Procedures (including critical care time)  Medications Ordered in UC Medications - No data to display  Initial Impression / Assessment and Plan / UC Course  I have reviewed the triage vital signs and the nursing notes.  Pertinent labs & imaging results that were available during my care of the patient were reviewed by me and considered in my medical decision making (see chart for details).    Plan: The diagnoses will be treated with the following: 1.  Upper respiratory infection: A.  Advised to continue DayQuil and NyQuil to treat congestion. 2.  Acute cough: A.  Advised patient to continue DayQuil and NyQuil to control cough and congestion. B.  Advised patient to take ibuprofen or Tylenol as needed for fever, body aches and discomfort. 3.  Patient advised to follow-up PCP or return to urgent care if symptoms fail to improve. Final Clinical Impressions(s) / UC Diagnoses   Final diagnoses:  Acute upper respiratory infection  Acute cough     Discharge Instructions      Advised to continue to use DayQuil and NyQuil to help  control the cough and congestion. Advised take ibuprofen or Motrin as needed for fever, body aches and pain.  Advised follow-up PCP or return to urgent care if symptoms  fail to improve over the next 5 to 7 days.    ED Prescriptions   None    PDMP not reviewed this encounter.   Nyoka Lint, PA-C 06/10/22 1453

## 2022-06-10 NOTE — ED Triage Notes (Signed)
Pt states that he has a cough, nasal congestion and head congestion. X2 days

## 2023-03-09 ENCOUNTER — Ambulatory Visit
Admission: RE | Admit: 2023-03-09 | Discharge: 2023-03-09 | Disposition: A | Discharge status: Home / Self Care | Payer: 59 | Source: Ambulatory Visit | Attending: Physician Assistant | Admitting: Physician Assistant

## 2023-03-09 VITALS — BP 125/77 | HR 66 | Temp 97.7°F | Resp 16

## 2023-03-09 DIAGNOSIS — L03114 Cellulitis of left upper limb: Secondary | ICD-10-CM

## 2023-03-09 MED ORDER — DOXYCYCLINE HYCLATE 100 MG PO CAPS: 100.0000 mg | ORAL_CAPSULE | Freq: Two times a day (BID) | ORAL | 0 refills | Status: DC

## 2023-03-09 NOTE — ED Provider Notes (Signed)
[name]    CSN: [id number] Arrival date & time: 03/09/23  1002      History   Chief Complaint Chief Complaint  Patient presents with   Insect Bite    possible infection due to an insect bite in the left arm - Entered by patient    HPI Dustin Stone is a 47 y.o. male.   Patient here today for evaluation of redness and swelling that is developed since he sustained suspected insect bite to left forearm near wrist 2 days ago.  He states the redness and swelling has continued to worsen.  He has not had pain or itching.  He has tried using ibuprofen and Benadryl without resolution.  He denies any fever, nausea, vomiting.  He has not had any trouble swallowing or shortness of breath.  The history is provided by the patient.    History reviewed. No pertinent past medical history.  Patient Active Problem List   Diagnosis Date Noted   Flu-like symptoms 06/24/2017   Nausea 06/24/2017   Irritable bowel- with wrong foods- Diarh 05/06/2016   Family history of diabetes mellitus 05/06/2016   Family history of coronary artery disease 05/06/2016   Family history of hyperlipidemia 05/06/2016   Family history of hypertension 05/06/2016   Family history of colonic polyps- all benign 05/06/2016    Past Surgical History:  Procedure Laterality Date   WISDOM TOOTH EXTRACTION         Home Medications    Prior to Admission medications   Medication Sig Start Date End Date Taking? Authorizing Provider  doxycycline (VIBRAMYCIN) 100 MG capsule Take 1 capsule (100 mg total) by mouth 2 (two) times daily. 03/09/23  Yes Tomi Bamberger, PA-C  Ascorbic Acid (VITAMIN C) 1000 MG tablet Take 1,000 mg by mouth daily.    [provider]  Cholecalciferol (VITAMIN D3 ADULT GUMMIES PO) Take 2,000 Units by mouth. 400 mg QD     [provider]  Probiotic Product (PROBIOTIC-10 PO) Take by mouth.    [provider]  sildenafil (VIAGRA) 50 MG tablet Take 1  tablet (50 mg total) by mouth daily as needed for erectile dysfunction. 01/31/20   Mayer Masker, PA-C    Family History Family History  Problem Relation Age of Onset   Heart attack Mother    Diabetes Mother    Heart attack Father    Diabetes Father    Hypertension Father    Hyperlipidemia Father    Multiple sclerosis Sister    Healthy Brother    Cancer Maternal Grandmother        breast   Heart attack Maternal Grandmother    Heart attack Paternal Grandfather    Healthy Brother    Healthy Brother     Social History Social History   Tobacco Use   Smoking status: Never   Smokeless tobacco: Never  Substance Use Topics   Alcohol use: No   Drug use: No     Allergies   Patient has no known allergies.   Review of Systems Review of Systems  Constitutional:  Negative for chills and fever.  Eyes:  Negative for discharge and redness.  Respiratory:  Negative for shortness of breath.   Skin:  Positive for color change and wound.  Neurological:  Negative for numbness.     Physical Exam Triage Vital Signs ED Triage Vitals  Encounter Vitals Group     BP 03/09/23 1022 125/77     Systolic BP Percentile --  Diastolic BP Percentile --      Pulse Rate 03/09/23 1022 66     Resp 03/09/23 1022 16     Temp 03/09/23 1022 97.7 F (36.5 C)     Temp Source 03/09/23 1022 Oral     SpO2 03/09/23 1022 97 %     Weight --      Height --      Head Circumference --      Peak Flow --      Pain Score 03/09/23 1025 0     Pain Loc --      Pain Education --      Exclude from Growth Chart --    No data found.  Updated Vital Signs BP 125/77 (BP Location: Left Arm)   Pulse 66   Temp 97.7 F (36.5 C) (Oral)   Resp 16   SpO2 97%      Physical Exam Vitals and nursing note reviewed.  Constitutional:      General: He is not in acute distress.    Appearance: Normal appearance. He is not ill-appearing.  HENT:     Head: Normocephalic and atraumatic.  Eyes:      Conjunctiva/sclera: Conjunctivae normal.  Cardiovascular:     Rate and Rhythm: Normal rate.  Pulmonary:     Effort: Pulmonary effort is normal. No respiratory distress.  Skin:    Comments: Diffuse erythema noted to left forearm with mild swelling and approximately 1 cm papule to distal forearm  Neurological:     Mental Status: He is alert.  Psychiatric:        Mood and Affect: Mood normal.        Behavior: Behavior normal.        Thought Content: Thought content normal.      UC Treatments / Results  Labs (all labs ordered are listed, but only abnormal results are displayed) Labs Reviewed - No data to display  EKG   Radiology No results found.  Procedures Procedures (including critical care time)  Medications Ordered in UC Medications - No data to display  Initial Impression / Assessment and Plan / UC Course  I have reviewed the triage vital signs and the nursing notes.  Pertinent labs & imaging results that were available during my care of the patient were reviewed by me and considered in my medical decision making (see chart for details).    Will treat to cover cellulitis with doxycycline.  Advise follow-up if no gradual improvement or with any further concerns or worsening symptoms.  Patient expresses understanding.  Final Clinical Impressions(s) / UC Diagnoses   Final diagnoses:  Cellulitis of left upper extremity   Discharge Instructions   None    ED Prescriptions     Medication Sig Dispense Auth. Provider   doxycycline (VIBRAMYCIN) 100 MG capsule Take 1 capsule (100 mg total) by mouth 2 (two) times daily. 20 capsule Tomi Bamberger, PA-C      PDMP not reviewed this encounter.   Tomi Bamberger, PA-C 03/09/23 1035

## 2023-03-09 NOTE — ED Triage Notes (Signed)
Noticed a bugbite on left arm he thought was a mosquito bite on Saturday. Redness and swelling progressed over the week. Presents today with dependant redness and swelling that extends from left wrist into elbow. Denies pain and itching. Using ibuprofen and benadryl to treat at home.

## 2023-06-22 ENCOUNTER — Ambulatory Visit: Payer: Self-pay | Admitting: Physician Assistant

## 2023-06-22 NOTE — Telephone Encounter (Signed)
Copied from CRM [id number]. Topic: Clinical - Red Word Triage >> Jun 22, 2023 12:32 PM Adele Barthel wrote: Red Word that prompted transfer to Nurse Triage: Patient began having flu like symptoms last night. Body aches, felt like he was running fever last night but no temperature this morning, severe cough, severe head pressure, nasal congestion, started having diarrhea today. No difficulties breathing.   Chief Complaint: Flu like symptoms  Symptoms: Cough, congestion, body aches, headache, diarrhea  Frequency: Constant  Pertinent Negatives: Patient denies fever, shortness of breath  Disposition: [] ED /[] Urgent Care (no appt availability in office) / [] Appointment(In office/virtual)/ []  [facility]/ [x] Home Care/ [] Refused Recommended Disposition /[] [facility]/ []  Follow-up with PCP Additional Notes: Patient's wife called to report that the patient began to develop flu like symptoms last night. She states he has had cough, chills, congestion, headache, and body aches. She states he has not had a recorded fever and has no difficulty breathing. She states that they were exposed to the flu, RSV, and COVID recently. Home care advice given and advised her to call back if patient's symptoms worsened. She verbalized understanding and agreement with this plan.    Reason for Disposition  [1] Probable mild influenza (no fever) or a common cold, with no complications AND [2] NOT HIGH RISK  Answer Assessment - Initial Assessment Questions 1. WORST SYMPTOM: "What is your worst symptom?" (e.g., cough, runny nose, muscle aches, headache, sore throat, fever)      Cough 2. ONSET: "When did your flu symptoms start?"      Last night 3. COUGH: "How bad is the cough?"       Moderate 4. RESPIRATORY DISTRESS: "Describe your breathing."      No 5. FEVER: "Do you have a fever?" If Yes, ask: "What is your temperature, how was it measured, and when did it start?"     No 6. EXPOSURE: "Were you exposed  to someone with influenza?"       Exposed to Flu, RSV, and COVID 7. FLU VACCINE: "Did you get a flu shot this year?"     No 8. HIGH RISK DISEASE: "Do you have any chronic medical problems?" (e.g., heart or lung disease, asthma, weak immune system, or other HIGH RISK conditions)     No 10. OTHER SYMPTOMS: "Do you have any other symptoms?"  (e.g., runny nose, muscle aches, headache, sore throat)       Congestion, body aches, diarrhea. chills  Protocols used: Influenza (Flu) - [facility]

## 2023-11-28 ENCOUNTER — Other Ambulatory Visit: Payer: Self-pay

## 2023-11-28 ENCOUNTER — Emergency Department (HOSPITAL_COMMUNITY)
Admission: EM | Admit: 2023-11-28 | Discharge: 2023-11-28 | Disposition: A | Discharge status: Home / Self Care | Attending: Emergency Medicine | Admitting: Emergency Medicine

## 2023-11-28 DIAGNOSIS — T63451A Toxic effect of venom of hornets, accidental (unintentional), initial encounter: Secondary | ICD-10-CM | POA: Insufficient documentation

## 2023-11-28 DIAGNOSIS — T63461A Toxic effect of venom of wasps, accidental (unintentional), initial encounter: Secondary | ICD-10-CM | POA: Insufficient documentation

## 2023-11-28 DIAGNOSIS — T63441A Toxic effect of venom of bees, accidental (unintentional), initial encounter: Secondary | ICD-10-CM | POA: Insufficient documentation

## 2023-11-28 DIAGNOSIS — L509 Urticaria, unspecified: Secondary | ICD-10-CM | POA: Diagnosis not present

## 2023-11-28 DIAGNOSIS — R21 Rash and other nonspecific skin eruption: Secondary | ICD-10-CM | POA: Diagnosis present

## 2023-11-28 DIAGNOSIS — L500 Allergic urticaria: Secondary | ICD-10-CM | POA: Insufficient documentation

## 2023-11-28 DIAGNOSIS — R9431 Abnormal electrocardiogram [ECG] [EKG]: Secondary | ICD-10-CM | POA: Diagnosis not present

## 2023-11-28 DIAGNOSIS — T782XXA Anaphylactic shock, unspecified, initial encounter: Secondary | ICD-10-CM | POA: Insufficient documentation

## 2023-11-28 DIAGNOSIS — T7840XA Allergy, unspecified, initial encounter: Secondary | ICD-10-CM | POA: Diagnosis not present

## 2023-11-28 MED ORDER — SODIUM CHLORIDE 0.9 % IV BOLUS
1000.0000 mL | Freq: Once | INTRAVENOUS | Status: AC
  Administered 2023-11-28: 1000 mL via INTRAVENOUS

## 2023-11-28 MED ORDER — FAMOTIDINE IN NACL 20-0.9 MG/50ML-% IV SOLN
20.0000 mg | Freq: Once | INTRAVENOUS | Status: AC
  Administered 2023-11-28: 20 mg via INTRAVENOUS
  Filled 2023-11-28: qty 50

## 2023-11-28 MED ORDER — METHYLPREDNISOLONE SODIUM SUCC 125 MG IJ SOLR
125.0000 mg | Freq: Once | INTRAMUSCULAR | Status: AC
  Administered 2023-11-28: 125 mg via INTRAVENOUS
  Filled 2023-11-28: qty 2

## 2023-11-28 MED ORDER — PREDNISONE 50 MG PO TABS: 50.0000 mg | ORAL_TABLET | Freq: Every day | ORAL | 0 refills | Status: AC

## 2023-11-28 MED ORDER — SODIUM CHLORIDE 0.9 % IV SOLN: INTRAVENOUS | Status: DC

## 2023-11-28 MED ORDER — EPINEPHRINE 0.3 MG/0.3ML IJ SOAJ: 0.3000 mg | INTRAMUSCULAR | 0 refills | Status: AC | PRN

## 2023-11-28 NOTE — ED Provider Notes (Signed)
 [facility] AT [facility] Provider Note   CSN: [id number] Arrival date & time: 11/28/23  1030     Patient presents with: No chief complaint on file.   Dustin Stone is a 48 y.o. male.   The history is provided by the patient and medical records. No language interpreter was used.  Allergic Reaction Presenting symptoms: difficulty breathing, itching, rash and swelling   Presenting symptoms: no difficulty swallowing and no wheezing   Severity:  Moderate Prior allergic episodes:  No prior episodes Context: insect bite/sting   Relieved by:  Nothing Worsened by:  Nothing Ineffective treatments:  Antihistamines      Prior to Admission medications   Medication Sig Start Date End Date Taking? Authorizing Provider  Ascorbic Acid (VITAMIN C) 1000 MG tablet Take 1,000 mg by mouth daily.    [provider]  Cholecalciferol (VITAMIN D3 ADULT GUMMIES PO) Take 2,000 Units by mouth. 400 mg QD     [provider]  doxycycline  (VIBRAMYCIN ) 100 MG capsule Take 1 capsule (100 mg total) by mouth 2 (two) times daily. 03/09/23   Billy Asberry FALCON, PA-C  Probiotic Product (PROBIOTIC-10 PO) Take by mouth.    [provider]  sildenafil  (VIAGRA ) 50 MG tablet Take 1 tablet (50 mg total) by mouth daily as needed for erectile dysfunction. 01/31/20   Abonza, Maritza, PA-C    Allergies: Patient has no known allergies.    Review of Systems  Constitutional:  Positive for fatigue. Negative for chills and fever.  HENT:  Positive for facial swelling. Negative for congestion, trouble swallowing and voice change.   Eyes:  Negative for visual disturbance.  Respiratory:  Positive for chest tightness and shortness of breath. Negative for cough, wheezing and stridor.   Cardiovascular:  Negative for chest pain and palpitations.  Gastrointestinal:  Negative for abdominal pain, constipation, diarrhea, nausea and vomiting.  Genitourinary:  Negative for  flank pain and frequency.  Musculoskeletal:  Negative for back pain, neck pain and neck stiffness.  Skin:  Positive for itching and rash.  Neurological:  Negative for headaches.  Psychiatric/Behavioral:  Negative for agitation and confusion.   All other systems reviewed and are negative.   Updated Vital Signs There were no vitals taken for this visit.  Physical Exam Vitals and nursing note reviewed.  Constitutional:      General: He is not in acute distress.    Appearance: He is well-developed. He is not ill-appearing, toxic-appearing or diaphoretic.  HENT:     Head: Normocephalic and atraumatic.     Nose: No congestion or rhinorrhea.     Mouth/Throat:     Pharynx: No oropharyngeal exudate or posterior oropharyngeal erythema.  Eyes:     Extraocular Movements: Extraocular movements intact.     Conjunctiva/sclera: Conjunctivae normal.     Pupils: Pupils are equal, round, and reactive to light.  Cardiovascular:     Rate and Rhythm: Normal rate and regular rhythm.     Pulses: Normal pulses.     Heart sounds: No murmur heard. Pulmonary:     Effort: Pulmonary effort is normal. No respiratory distress.     Breath sounds: Normal breath sounds. No wheezing, rhonchi or rales.  Chest:     Chest wall: No tenderness.  Abdominal:     General: Abdomen is flat.     Palpations: Abdomen is soft.     Tenderness: There is no abdominal tenderness. There is no guarding or rebound.  Musculoskeletal:  General: No swelling.     Cervical back: Neck supple. No tenderness.  Skin:    General: Skin is warm and dry.     Capillary Refill: Capillary refill takes less than 2 seconds.     Findings: Erythema and rash present.  Neurological:     General: No focal deficit present.     Mental Status: He is alert.     Sensory: No sensory deficit.     Motor: No weakness.  Psychiatric:        Mood and Affect: Mood normal.     (all labs ordered are listed, but only abnormal results are  displayed) Labs Reviewed - No data to display  EKG: EKG Interpretation Date/Time:  Saturday November 28 2023 10:35:38 EDT Ventricular Rate:  87 PR Interval:  159 QRS Duration:  90 QT Interval:  349 QTC Calculation: 420 R Axis:   93  Text Interpretation: Sinus rhythm Borderline right axis deviation no prior ECG for comparison No STEMI Confirmed by Ginger Barefoot ([id number]) on 11/28/2023 12:16:38 PM  Radiology: No results found.   Procedures   Medications Ordered in the ED  sodium chloride  0.9 % bolus 1,000 mL (1,000 mLs Intravenous New Bag/Given 11/28/23 1040)    And  0.9 %  sodium chloride  infusion ( Intravenous New Bag/Given 11/28/23 1050)  methylPREDNISolone  sodium succinate (SOLU-MEDROL ) 125 mg/2 mL injection 125 mg (125 mg Intravenous Given 11/28/23 1040)  famotidine  (PEPCID ) IVPB 20 mg premix (0 mg Intravenous Stopped 11/28/23 1115)   CRITICAL CARE Performed by: Lonni PARAS Maryjayne Kleven Total critical care time: 20 minutes Critical care time was exclusive of separately billable procedures and treating other patients. Critical care was necessary to treat or prevent imminent or life-threatening deterioration. Critical care was time spent personally by me on the following activities: development of treatment plan with patient and/or surrogate as well as nursing, discussions with consultants, evaluation of patient's response to treatment, examination of patient, obtaining history from patient or surrogate, ordering and performing treatments and interventions, ordering and review of laboratory studies, ordering and review of radiographic studies, pulse oximetry and re-evaluation of patient's condition.                                   Medical Decision Making Risk Prescription drug management.    Dustin Stone is a 48 y.o. male with no significant past medical history who presents with allergic reaction.  According to patient, he was stung on the right hand by a bee or wasp with  some kind and shortly after he started having reaction.  He is never had significant reaction before but started having rash, itching, he took some Benadryl at home.  He says it did not improve and he started having lip swelling and feeling his tongue was swelling.  He also reported some shortness of breath.  He denies focal nausea or vomiting and has not passed out.  Denying chest pain.  Per report he received IM epinephrine  and route.  On my initial exam he does have diffuse rash with urticaria and has pruritus.  I did not appreciate stridor on neck auscultation but he does have some swelling of his lips he says.  He says the tongue swelling seems to be improving after the epinephrine .  Lungs were clear without significant wheezing for me.  Patient otherwise jittery but resting after the epinephrine .  We will give him some steroids and some other  antihistamines we will give some fluids.  Anticipate he will need to be watched for several hours to make sure symptoms continue to improve but I suspect this is anaphylaxis due to the bee sting.  Anticipate discharge with a burst of steroids and prescription for EpiPen  if symptoms do not worsen.  Anticipate reassessment after workup     2:22 PM Patient continues to feel well and his reaction has subsided.  He was able to ambulate to bathroom back without lightheadedness or feeling ill.  Heart rate is improved, no further rash.  Patient is well-appearing.  Will discharge with prescription for burst of steroids and EpiPen 's.  He knows to use antihistamines for the itching.  He agrees with PCP follow-up and return precautions and was discharged in good condition with resolution of reaction.     Final diagnoses:  Anaphylaxis, initial encounter  Allergic reaction, initial encounter  Toxic reaction to hornets, wasps and bees, accidental or unintentional, initial encounter    ED Discharge Orders          Ordered    predniSONE  (DELTASONE ) 50 MG tablet   Daily        11/28/23 1424    EPINEPHrine  0.3 mg/0.3 mL IJ SOAJ injection  As needed        11/28/23 1424            Clinical Impression: 1. Anaphylaxis, initial encounter   2. Allergic reaction, initial encounter   3. Toxic reaction to hornets, wasps and bees, accidental or unintentional, initial encounter     Disposition: Discharge  Condition: Good  I have discussed the results, Dx and Tx plan with the pt(& family if present). He/she/they expressed understanding and agree(s) with the plan. Discharge instructions discussed at great length. Strict return precautions discussed and pt &/or family have verbalized understanding of the instructions. No further questions at time of discharge.    New Prescriptions   EPINEPHRINE  0.3 MG/0.3 ML IJ SOAJ INJECTION    Inject 0.3 mg into the muscle as needed for anaphylaxis.   PREDNISONE  (DELTASONE ) 50 MG TABLET    Take 1 tablet (50 mg total) by mouth daily for 5 days.    Follow Up: your PCP          Garrus Gauthreaux, Lonni PARAS, MD 11/28/23 1425

## 2023-11-28 NOTE — ED Triage Notes (Signed)
 Patient arrives via [facility] from home for allergic reaction. Stung by wasp. Right hand, full hives on left side. Ambulated without assistance but endorses dizziness. Felt tightness in throat without airway compromise.   Initially 88 palp, 0.3epi IM, up to 110 systolic, last 124/71. 96 on room air, CBG 165  18 LFA, 200 cc NS and 50 of benadryl.

## 2023-11-28 NOTE — Discharge Instructions (Signed)
 Your history, exam, and evaluation are consistent with anaphylactic allergic reaction to the sting.  You drastically improved with medications and were observed for over 4 hours without worsening of symptoms.  We feel you are now safe for discharge home.  Please take the burst of steroids for the next 5 days starting tomorrow and fill the prescription for EpiPen  if you need it.  You may also use antihistamines like Benadryl or Pepcid  if you have more itching.  Please follow-up with your primary doctor.  Please rest and stay hydrated.  If any symptoms change or worsen acutely, please turn to the nearest emergency department.

## 2023-11-28 NOTE — ED Notes (Signed)
 Patient dc by RN, ambulatory to lobby at time of dc. Prescription in hand

## 2024-01-21 ENCOUNTER — Ambulatory Visit

## 2024-01-21 VITALS — BP 118/78 | HR 63 | Temp 97.5°F | Ht 70.0 in | Wt 187.1 lb

## 2024-01-21 DIAGNOSIS — Z13228 Encounter for screening for other metabolic disorders: Secondary | ICD-10-CM

## 2024-01-21 DIAGNOSIS — Z7689 Persons encountering health services in other specified circumstances: Secondary | ICD-10-CM | POA: Insufficient documentation

## 2024-01-21 DIAGNOSIS — Z1321 Encounter for screening for nutritional disorder: Secondary | ICD-10-CM | POA: Diagnosis not present

## 2024-01-21 DIAGNOSIS — N529 Male erectile dysfunction, unspecified: Secondary | ICD-10-CM | POA: Insufficient documentation

## 2024-01-21 DIAGNOSIS — Z130 Encounter for screening for diseases of the blood and blood-forming organs and certain disorders involving the immune mechanism: Secondary | ICD-10-CM

## 2024-01-21 DIAGNOSIS — Z91030 Bee allergy status: Secondary | ICD-10-CM | POA: Insufficient documentation

## 2024-01-21 DIAGNOSIS — Z1329 Encounter for screening for other suspected endocrine disorder: Secondary | ICD-10-CM

## 2024-01-21 DIAGNOSIS — Z1211 Encounter for screening for malignant neoplasm of colon: Secondary | ICD-10-CM | POA: Diagnosis not present

## 2024-01-21 MED ORDER — SILDENAFIL CITRATE 50 MG PO TABS: 50.0000 mg | ORAL_TABLET | Freq: Every day | ORAL | 2 refills | Status: AC | PRN

## 2024-01-21 NOTE — Patient Instructions (Signed)
 VISIT SUMMARY: Today, you came in for a routine wellness visit and to reestablish care. We discussed your general health, preventive screenings, and specific concerns including your bee sting allergy and erectile dysfunction.  YOUR PLAN: -ADULT WELLNESS VISIT: This visit was to check your overall health and address any concerns. We will schedule fasting blood work to check your kidney, liver, thyroid function, blood sugar, lipids, and cholesterol levels. We also discussed scheduling a colonoscopy for November or December to screen for colon cancer. We will review your blood work results through [facility].  -ERECTILE DYSFUNCTION: Erectile dysfunction is when you have difficulty getting or keeping an erection. You have been managing this with sildenafil  50 mg, which has been effective for you. We have refilled your prescription for sildenafil  50 mg.  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 Please ensure you always have your EpiPen  available in case of an emergency.  INSTRUCTIONS: Please schedule your fasting blood work and colonoscopy as discussed. We will review your blood work results through [facility]. If you have any questions or concerns, feel free to reach out.  If you have any problems before your next visit feel free to message me via MyChart (minor issues or questions) or call the office, otherwise you may reach out to schedule an office visit.  Thank you! Saddie Sacks, PA-C

## 2024-01-21 NOTE — Assessment & Plan Note (Signed)
-   Ensure EpiPen  availability for emergencies.

## 2024-01-21 NOTE — Assessment & Plan Note (Signed)
 Routine wellness visit for a 48 year old male with no acute concerns. - Schedule fasting blood work today (see orders) - Order colonoscopy today - Discuss blood work results via Clinical cytogeneticist. -Declines flu shot today

## 2024-01-21 NOTE — Progress Notes (Signed)
 New Patient Office Visit  Subjective    Patient ID: Dustin Stone, male    DOB: 1975/11/08  Age: 48 y.o. MRN: [id number]  CC:  Chief Complaint  Patient presents with   New Patient (Initial Visit)    Establish Care    Discussed the use of AI scribe software for clinical note transcription with the patient, who gave verbal consent to proceed.  History of Present Illness   Dustin Stone is a 48 year old male who presents for reestablishing care and routine health screenings.  Allergic reactions - Bee sting allergy with EpiPen  carried for emergency use  Erectile dysfunction - Previously prescribed sildenafil  50 mg, which was effective without side effects - Interested in resuming sildenafil   Preventive health maintenance and lifestyle - No blood work since 2021, at which time all results were normal - At age for colon cancer screening - Regular bowel movements - No current symptoms of illness  - Sleeping well and eating well balanced.  - Has 3 children aged 35, 36, and 3.  - He is a Forensic scientist:  Colon Cancer: Order placed today Lung Cancer: N/A - nonsmoker Breast Cancer: N/A Diabetes: Checking A1c with labs HLD: Checking lipid panel with labs The ASCVD Risk score (Arnett DK, et al., 2019) failed to calculate for the following reasons:   Cannot find a previous HDL lab   Cannot find a previous total cholesterol lab  Outpatient Encounter Medications as of 01/21/2024  Medication Sig   EPINEPHrine  0.3 mg/0.3 mL IJ SOAJ injection Inject 0.3 mg into the muscle as needed for anaphylaxis.   Probiotic Product (PROBIOTIC PO) Take 1 capsule by mouth at bedtime.   sildenafil  (VIAGRA ) 50 MG tablet Take 1 tablet (50 mg total) by mouth daily as needed for erectile dysfunction.   [DISCONTINUED] diphenhydrAMINE (BENADRYL) 25 MG tablet Take 50 mg by mouth once as needed (allergic reaction). (Patient not taking: Reported on 01/21/2024)   [DISCONTINUED]  diphenhydramine-acetaminophen (TYLENOL PM) 25-500 MG TABS tablet Take 1 tablet by mouth at bedtime as needed (sleep, pain). (Patient not taking: Reported on 01/21/2024)   No facility-administered encounter medications on file as of 01/21/2024.    History reviewed. No pertinent past medical history.  Past Surgical History:  Procedure Laterality Date   WISDOM TOOTH EXTRACTION      Family History  Problem Relation Age of Onset   Heart attack Mother    Diabetes Mother    Heart attack Father    Diabetes Father    Hypertension Father    Hyperlipidemia Father    Multiple sclerosis Sister    Healthy Brother    Cancer Maternal Grandmother        breast   Heart attack Maternal Grandmother    Heart attack Paternal Grandfather    Healthy Brother    Healthy Brother     Social History   Socioeconomic History   Marital status: Married    Spouse name: Not on file   Number of children: Not on file   Years of education: Not on file   Highest education level: Associate degree: occupational, Scientist, product/process development, or vocational program  Occupational History   Not on file  Tobacco Use   Smoking status: Never   Smokeless tobacco: Never  Substance and Sexual Activity   Alcohol use: No   Drug use: No   Sexual activity: Yes    Birth control/protection: I.U.D.  Other Topics Concern   Not on  file  Social History Narrative   Not on file   Social Drivers of Health   Financial Resource Strain: Low Risk  (01/20/2024)   Overall Financial Resource Strain (CARDIA)    Difficulty of Paying Living Expenses: Not very hard  Food Insecurity: No Food Insecurity (01/20/2024)   Hunger Vital Sign    Worried About Running Out of Food in the Last Year: Never true    Ran Out of Food in the Last Year: Never true  Transportation Needs: No Transportation Needs (01/20/2024)   PRAPARE - Administrator, Civil Service (Medical): No    Lack of Transportation (Non-Medical): No  Physical Activity: Insufficiently  Active (01/20/2024)   Exercise Vital Sign    Days of Exercise per Week: 2 days    Minutes of Exercise per Session: 20 min  Stress: No Stress Concern Present (01/20/2024)   [employer] of Occupational Health - Occupational Stress Questionnaire    Feeling of Stress: Not at all  Social Connections: Socially Integrated (01/20/2024)   Social Connection and Isolation Panel    Frequency of Communication with Friends and Family: More than three times a week    Frequency of Social Gatherings with Friends and Family: Three times a week    Attends Religious Services: More than 4 times per year    Active Member of Clubs or Organizations: Yes    Attends Banker Meetings: More than 4 times per year    Marital Status: Married  Catering manager Violence: Not At Risk (01/21/2024)   Humiliation, Afraid, Rape, and Kick questionnaire    Fear of Current or Ex-Partner: No    Emotionally Abused: No    Physically Abused: No    Sexually Abused: No    ROS  Per HPI      Objective    BP 118/78   Pulse 63   Temp (!) 97.5 F (36.4 C) (Oral)   Ht 5' 10 (1.778 m)   Wt 187 lb 1.9 oz (84.9 kg)   SpO2 96%   BMI 26.85 kg/m   Physical Exam Constitutional:      General: He is not in acute distress.    Appearance: Normal appearance.  HENT:     Right Ear: Tympanic membrane normal.     Left Ear: Tympanic membrane normal.     Mouth/Throat:     Mouth: Mucous membranes are moist.     Pharynx: Oropharynx is clear.  Eyes:     Pupils: Pupils are equal, round, and reactive to light.  Cardiovascular:     Rate and Rhythm: Normal rate and regular rhythm.     Heart sounds: Normal heart sounds. No murmur heard.    No friction rub. No gallop.  Pulmonary:     Effort: Pulmonary effort is normal. No respiratory distress.     Breath sounds: Normal breath sounds.  Abdominal:     General: Abdomen is flat. Bowel sounds are normal.     Palpations: Abdomen is soft.  Musculoskeletal:        General: No  swelling.     Cervical back: Neck supple.  Lymphadenopathy:     Cervical: No cervical adenopathy.  Skin:    General: Skin is warm and dry.  Neurological:     General: No focal deficit present.     Mental Status: He is alert.  Psychiatric:        Mood and Affect: Mood normal.        Behavior: Behavior normal.  Thought Content: Thought content normal.        Assessment & Plan:   Screen for colon cancer -     Ambulatory referral to Gastroenterology  Screening for endocrine, nutritional, metabolic and immunity disorder -     VITAMIN D  25 Hydroxy (Vit-D Deficiency, Fractures); Future -     TSH; Future -     Hemoglobin A1c; Future -     Lipid panel; Future -     Comprehensive metabolic panel with GFR; Future -     CBC with Differential/Platelet; Future  Erectile dysfunction, unspecified erectile dysfunction type Assessment & Plan: Erectile dysfunction managed with sildenafil  50 mg without issues. - Refill sildenafil  50 mg prescription.   Encounter to establish care Assessment & Plan: Routine wellness visit for a 48 year old male with no acute concerns. - Schedule fasting blood work today (see orders) - Order colonoscopy today - Discuss blood work results via Clinical cytogeneticist. -Declines flu shot today   Allergy to honey bee venom Assessment & Plan: - Ensure EpiPen  availability for emergencies.    Other orders -     Sildenafil  Citrate; Take 1 tablet (50 mg total) by mouth daily as needed for erectile dysfunction.  Dispense: 10 tablet; Refill: 2    Return in about 1 year (around 01/20/2025) for Physical.   Saddie JULIANNA Sacks, PA-C

## 2024-01-21 NOTE — Assessment & Plan Note (Signed)
 Erectile dysfunction managed with sildenafil  50 mg without issues. - Refill sildenafil  50 mg prescription.

## 2024-01-28 ENCOUNTER — Other Ambulatory Visit

## 2024-01-28 DIAGNOSIS — Z130 Encounter for screening for diseases of the blood and blood-forming organs and certain disorders involving the immune mechanism: Secondary | ICD-10-CM

## 2024-01-28 DIAGNOSIS — Z1321 Encounter for screening for nutritional disorder: Secondary | ICD-10-CM | POA: Diagnosis not present

## 2024-01-28 DIAGNOSIS — Z13228 Encounter for screening for other metabolic disorders: Secondary | ICD-10-CM | POA: Diagnosis not present

## 2024-01-28 DIAGNOSIS — Z1329 Encounter for screening for other suspected endocrine disorder: Secondary | ICD-10-CM | POA: Diagnosis not present

## 2024-01-29 ENCOUNTER — Ambulatory Visit: Payer: Self-pay

## 2024-01-29 LAB — CBC WITH DIFFERENTIAL/PLATELET
Basophils Absolute: 0.1 x10E3/uL (ref 0.0–0.2)
Basos: 1 %
EOS (ABSOLUTE): 0.1 x10E3/uL (ref 0.0–0.4)
Eos: 2 %
Hematocrit: 46.8 % (ref 37.5–51.0)
Hemoglobin: 16 g/dL (ref 13.0–17.7)
Immature Grans (Abs): 0 x10E3/uL (ref 0.0–0.1)
Immature Granulocytes: 0 %
Lymphocytes Absolute: 2.6 x10E3/uL (ref 0.7–3.1)
Lymphs: 40 %
MCH: 30.5 pg (ref 26.6–33.0)
MCHC: 34.2 g/dL (ref 31.5–35.7)
MCV: 89 fL (ref 79–97)
Monocytes Absolute: 0.6 x10E3/uL (ref 0.1–0.9)
Monocytes: 9 %
Neutrophils Absolute: 3 x10E3/uL (ref 1.4–7.0)
Neutrophils: 47 %
Platelets: 212 x10E3/uL (ref 150–450)
RBC: 5.24 x10E6/uL (ref 4.14–5.80)
RDW: 13.1 % (ref 11.6–15.4)
WBC: 6.4 x10E3/uL (ref 3.4–10.8)

## 2024-01-29 LAB — LIPID PANEL
Chol/HDL Ratio: 4.5 ratio (ref 0.0–5.0)
Cholesterol, Total: 167 mg/dL (ref 100–199)
HDL: 37 mg/dL — ABNORMAL LOW (ref >39)
LDL Chol Calc (NIH): 119 mg/dL — ABNORMAL HIGH (ref 0–99)
Triglycerides: 57 mg/dL (ref 0–149)
VLDL Cholesterol Cal: 11 mg/dL (ref 5–40)

## 2024-01-29 LAB — COMPREHENSIVE METABOLIC PANEL WITH GFR
ALT: 42 IU/L (ref 0–44)
AST: 27 IU/L (ref 0–40)
Albumin: 4.2 g/dL (ref 4.1–5.1)
Alkaline Phosphatase: 51 IU/L (ref 44–121)
BUN/Creatinine Ratio: 13 (ref 9–20)
BUN: 16 mg/dL (ref 6–24)
Bilirubin Total: 0.7 mg/dL (ref 0.0–1.2)
CO2: 23 mmol/L (ref 20–29)
Calcium: 9.2 mg/dL (ref 8.7–10.2)
Chloride: 101 mmol/L (ref 96–106)
Creatinine, Ser: 1.22 mg/dL (ref 0.76–1.27)
Globulin, Total: 2.8 g/dL (ref 1.5–4.5)
Glucose: 118 mg/dL — ABNORMAL HIGH (ref 70–99)
Potassium: 4.4 mmol/L (ref 3.5–5.2)
Sodium: 137 mmol/L (ref 134–144)
Total Protein: 7 g/dL (ref 6.0–8.5)
eGFR: 75 mL/min/1.73 (ref >59)

## 2024-01-29 LAB — HEMOGLOBIN A1C
Est. average glucose Bld gHb Est-mCnc: 134 mg/dL
Hgb A1c MFr Bld: 6.3 % — ABNORMAL HIGH (ref 4.8–5.6)

## 2024-01-29 LAB — VITAMIN D 25 HYDROXY (VIT D DEFICIENCY, FRACTURES): Vit D, 25-Hydroxy: 34.1 ng/mL (ref 30.0–100.0)

## 2024-01-29 LAB — TSH: TSH: 1.65 u[IU]/mL (ref 0.450–4.500)

## 2024-01-29 NOTE — Telephone Encounter (Signed)
 Called and LVM/ patient needs to get scheduled for lab work. Sent patient a MyChart message as well.

## 2024-02-03 ENCOUNTER — Ambulatory Visit: Payer: Self-pay

## 2024-02-03 ENCOUNTER — Encounter: Payer: Self-pay | Admitting: Family Medicine

## 2024-02-03 ENCOUNTER — Ambulatory Visit: Admitting: Family Medicine

## 2024-02-03 VITALS — BP 136/76 | HR 98 | Temp 98.7°F | Resp 18 | Ht 70.0 in | Wt 186.6 lb

## 2024-02-03 DIAGNOSIS — H6592 Unspecified nonsuppurative otitis media, left ear: Secondary | ICD-10-CM | POA: Insufficient documentation

## 2024-02-03 MED ORDER — CETIRIZINE HCL 10 MG PO TABS: 10.0000 mg | ORAL_TABLET | Freq: Every day | ORAL | 11 refills | Status: AC

## 2024-02-03 MED ORDER — FLUTICASONE PROPIONATE 50 MCG/ACT NA SUSP: 2.0000 | Freq: Every day | NASAL | 6 refills | Status: AC

## 2024-02-03 MED ORDER — AMOXICILLIN-POT CLAVULANATE 875-125 MG PO TABS: 1.0000 | ORAL_TABLET | Freq: Two times a day (BID) | ORAL | 0 refills | Status: AC

## 2024-02-03 NOTE — Progress Notes (Signed)
 Acute Office Visit  Subjective:    Patient ID: Dustin Stone, male    DOB: May 05, 1976, 48 y.o.   MRN: [id number]  Chief Complaint  Patient presents with   Ear Pain   Discussed the use of AI scribe software for clinical note transcription with the patient, who gave verbal consent to proceed.  History of Present Illness   Dustin Stone is a 48 year old male who presents with unilateral ear pain and fullness.  Otalgia and aural fullness - Left-sided ear pain and sensation of fullness for approximately three days - Pain radiates to the left temple - No involvement of the right ear - Ear feels 'stopped up' - No otorrhea - No tinnitus - Benadryl provides some relief - No use of Flonase   Associated upper respiratory symptoms - Two weeks ago, experienced nasal congestion, coughing, and sneezing - Upper respiratory symptoms have resolved - No fever during this period  Absence of systemic and other localizing symptoms - No throat pain - No shortness of breath - No chest pain - No fever  Symptom recurrence and dental evaluation - Similar symptoms occurred around the same time last year - Previous episode included dental pain evaluated by a dentist, found to be normal - Symptoms resolved spontaneously before medical evaluation last year       Past Surgical History:  Procedure Laterality Date   WISDOM TOOTH EXTRACTION      Family History  Problem Relation Age of Onset   Heart attack Mother    Diabetes Mother    Heart attack Father    Diabetes Father    Hypertension Father    Hyperlipidemia Father    Multiple sclerosis Sister    Healthy Brother    Cancer Maternal Grandmother        breast   Heart attack Maternal Grandmother    Heart attack Paternal Grandfather    Healthy Brother    Healthy Brother     Social History   Socioeconomic History   Marital status: Married    Spouse name: Not on file   Number of children: Not on file   Years of  education: Not on file   Highest education level: Associate degree: occupational, Scientist, product/process development, or vocational program  Occupational History   Not on file  Tobacco Use   Smoking status: Never   Smokeless tobacco: Never  Substance and Sexual Activity   Alcohol use: No   Drug use: No   Sexual activity: Yes    Birth control/protection: I.U.D.  Other Topics Concern   Not on file  Social History Narrative   Not on file   Social Drivers of Health   Financial Resource Strain: Low Risk  (01/20/2024)   Overall Financial Resource Strain (CARDIA)    Difficulty of Paying Living Expenses: Not very hard  Food Insecurity: No Food Insecurity (01/20/2024)   Hunger Vital Sign    Worried About Running Out of Food in the Last Year: Never true    Ran Out of Food in the Last Year: Never true  Transportation Needs: No Transportation Needs (01/20/2024)   PRAPARE - Administrator, Civil Service (Medical): No    Lack of Transportation (Non-Medical): No  Physical Activity: Insufficiently Active (01/20/2024)   Exercise Vital Sign    Days of Exercise per Week: 2 days    Minutes of Exercise per Session: 20 min  Stress: No Stress Concern Present (01/20/2024)   [employer] of Occupational Health - Occupational  Stress Questionnaire    Feeling of Stress: Not at all  Social Connections: Socially Integrated (01/20/2024)   Social Connection and Isolation Panel    Frequency of Communication with Friends and Family: More than three times a week    Frequency of Social Gatherings with Friends and Family: Three times a week    Attends Religious Services: More than 4 times per year    Active Member of Clubs or Organizations: Yes    Attends Banker Meetings: More than 4 times per year    Marital Status: Married  Catering manager Violence: Not At Risk (01/21/2024)   Humiliation, Afraid, Rape, and Kick questionnaire    Fear of Current or Ex-Partner: No    Emotionally Abused: No    Physically Abused:  No    Sexually Abused: No    Outpatient Medications Prior to Visit  Medication Sig Dispense Refill   EPINEPHrine  0.3 mg/0.3 mL IJ SOAJ injection Inject 0.3 mg into the muscle as needed for anaphylaxis. 1 each 0   Probiotic Product (PROBIOTIC PO) Take 1 capsule by mouth at bedtime.     sildenafil  (VIAGRA ) 50 MG tablet Take 1 tablet (50 mg total) by mouth daily as needed for erectile dysfunction. 10 tablet 2   No facility-administered medications prior to visit.    Allergies  Allergen Reactions   Bee Venom Anaphylaxis and Swelling   Fish Allergy Nausea And Vomiting and Other (See Comments)    GI intolerance    Review of Systems  Constitutional:  Negative for appetite change, fatigue and fever.  HENT:  Positive for ear pain. Negative for congestion, sinus pressure, sore throat and tinnitus.   Eyes: Negative.   Respiratory:  Negative for cough, chest tightness, shortness of breath and wheezing.   Cardiovascular:  Negative for chest pain and palpitations.  Gastrointestinal:  Negative for abdominal pain, constipation, diarrhea, nausea and vomiting.  Endocrine: Negative.   Genitourinary:  Negative for dysuria and hematuria.  Musculoskeletal:  Negative for arthralgias, back pain, joint swelling and myalgias.  Skin:  Negative for rash.  Allergic/Immunologic: Negative.   Neurological:  Positive for headaches. Negative for dizziness, weakness and light-headedness.  Hematological: Negative.   Psychiatric/Behavioral:  Negative for dysphoric mood. The patient is not nervous/anxious.        Objective:        02/03/2024    3:40 PM 01/21/2024    1:03 PM 11/28/2023    2:00 PM  Vitals with BMI  Height 5' 10 5' 10   Weight 186 lbs 10 oz 187 lbs 2 oz   BMI 26.77 26.85   Systolic 136 118 886  Diastolic 76 78 74  Pulse 98 63 70    No data found.   Physical Exam Vitals reviewed.  Constitutional:      General: He is not in acute distress.    Appearance: Normal appearance. He is  ill-appearing.  HENT:     Right Ear: Hearing, tympanic membrane, ear canal and external ear normal. No tenderness. No middle ear effusion. Tympanic membrane is not erythematous.     Left Ear: No decreased hearing noted. Tenderness present. No drainage. A middle ear effusion is present. Tympanic membrane is erythematous.  Cardiovascular:     Rate and Rhythm: Normal rate and regular rhythm.     Heart sounds: Normal heart sounds. No murmur heard. Pulmonary:     Effort: Pulmonary effort is normal.     Breath sounds: Normal breath sounds. No wheezing.  Abdominal:  General: Bowel sounds are normal.     Palpations: Abdomen is soft.     Tenderness: There is no abdominal tenderness.  Neurological:     Mental Status: He is alert. Mental status is at baseline.  Psychiatric:        Mood and Affect: Mood normal.        Behavior: Behavior normal.    Health Maintenance Due  Topic Date Due   Colonoscopy  Never done    There are no preventive care reminders to display for this patient.   Lab Results  Component Value Date   TSH 1.650 01/28/2024   Lab Results  Component Value Date   WBC 6.4 01/28/2024   HGB 16.0 01/28/2024   HCT 46.8 01/28/2024   MCV 89 01/28/2024   PLT 212 01/28/2024   Lab Results  Component Value Date   NA 137 01/28/2024   K 4.4 01/28/2024   CO2 23 01/28/2024   GLUCOSE 118 (H) 01/28/2024   BUN 16 01/28/2024   CREATININE 1.22 01/28/2024   BILITOT 0.7 01/28/2024   ALKPHOS 51 01/28/2024   AST 27 01/28/2024   ALT 42 01/28/2024   PROT 7.0 01/28/2024   ALBUMIN 4.2 01/28/2024   CALCIUM 9.2 01/28/2024   EGFR 75 01/28/2024   Lab Results  Component Value Date   CHOL 167 01/28/2024   Lab Results  Component Value Date   HDL 37 (L) 01/28/2024   Lab Results  Component Value Date   LDLCALC 119 (H) 01/28/2024   Lab Results  Component Value Date   TRIG 57 01/28/2024   Lab Results  Component Value Date   CHOLHDL 4.5 01/28/2024   Lab Results  Component  Value Date   HGBA1C 6.3 (H) 01/28/2024       Assessment & Plan:  Otitis media with effusion, left Assessment & Plan: Serous otitis media, right ear Fluid accumulation in the right ear causing pain and fullness. Erythema noted on exam. Denies fever. Likely related to environmental allergens. - Prescribed antibiotics to address potential bacterial involvement. Discussed potential GI upset and recommended probiotics. - Recommended Flonase  nasal spray, instructing to spray towards the ear. - Advised cetirizine  (Zyrtec ) for allergy management. If drowsiness occurs, take at night. - Encouraged increased fluid intake and rest. - FU if symptoms do not resolve or get worse.   Orders: -     Cetirizine  HCl; Take 1 tablet (10 mg total) by mouth daily.  Dispense: 30 tablet; Refill: 11 -     Fluticasone  Propionate; Place 2 sprays into both nostrils daily.  Dispense: 16 g; Refill: 6 -     Amoxicillin -Pot Clavulanate; Take 1 tablet by mouth 2 (two) times daily for 7 days.  Dispense: 14 tablet; Refill: 0     Meds ordered this encounter  Medications   cetirizine  (ZYRTEC ) 10 MG tablet    Sig: Take 1 tablet (10 mg total) by mouth daily.    Dispense:  30 tablet    Refill:  11   fluticasone  (FLONASE ) 50 MCG/ACT nasal spray    Sig: Place 2 sprays into both nostrils daily.    Dispense:  16 g    Refill:  6   amoxicillin -clavulanate (AUGMENTIN ) 875-125 MG tablet    Sig: Take 1 tablet by mouth 2 (two) times daily for 7 days.    Dispense:  14 tablet    Refill:  0    Follow-up: Return if symptoms worsen or fail to improve.  An After Visit Summary was printed  and given to the patient.  I attest that I have reviewed this visit and agree with the plan scribed by my staff.   Harrie CHRISTELLA Cedar, FNP [facility] [phone]

## 2024-02-03 NOTE — Telephone Encounter (Signed)
 FYI Only or Action Required?: FYI only for provider.  Patient was last seen in primary care on 01/21/2024 by Gayle Saddie FALCON, PA-C.  Called Nurse Triage reporting Ear Pain.  Symptoms began yesterday.  Interventions attempted: OTC medications: ibuprofen and Rest, hydration, or home remedies.  Symptoms are: unchanged.  Triage Disposition: See HCP Within 4 Hours (Or PCP Triage)  Patient/caregiver understands and will follow disposition?: Yes  Copied from CRM [id number]. Topic: Clinical - Red Word Triage >> Feb 03, 2024 12:16 PM Fonda T wrote: [facility] that prompted transfer to Nurse Triage: Patient has left ear pain, that radiates down to jaw. Reason for Disposition  [1] SEVERE pain (e.g., excruciating) and [2] not improved 2 hours after pain medicine (e.g., acetaminophen or ibuprofen)  Answer Assessment - Initial Assessment Questions 1. LOCATION: Which ear is involved?     Left ear 2. ONSET: When did the ear pain start?      Started within the last 24 to 48 hours 3. SEVERITY: How bad is the pain?  (Scale 1-10; mild, moderate or severe)     7 out of 10-ibuprofen isn't helping 4. URI SYMPTOMS: Do you have a runny nose or cough?     Cough, congestion 5. FEVER: Do you have a fever? If Yes, ask: What is your temperature, how was it measured, and when did it start?     no 6. CAUSE: Have you been swimming recently?, How often do you use Q-TIPS?, Have you had any recent air travel or scuba diving?     Uses Q-tip but pain started before using q-tips 7. OTHER SYMPTOMS: Do you have any other symptoms? (e.g., decreased hearing, dizziness, headache, stiff neck, vomiting)     Pain from ear is going into jaw, headache  Protocols used: Earache-A-AH

## 2024-02-03 NOTE — Assessment & Plan Note (Addendum)
 Serous otitis media, right ear Fluid accumulation in the right ear causing pain and fullness. Erythema noted on exam. Denies fever. Likely related to environmental allergens. - Prescribed antibiotics to address potential bacterial involvement. Discussed potential GI upset and recommended probiotics. - Recommended Flonase  nasal spray, instructing to spray towards the ear. - Advised cetirizine  (Zyrtec ) for allergy management. If drowsiness occurs, take at night. - Encouraged increased fluid intake and rest. - FU if symptoms do not resolve or get worse.

## 2024-02-06 ENCOUNTER — Emergency Department
Admission: EM | Admit: 2024-02-06 | Discharge: 2024-02-06 | Disposition: A | Discharge status: Home / Self Care | Attending: Emergency Medicine | Admitting: Emergency Medicine

## 2024-02-06 ENCOUNTER — Other Ambulatory Visit: Payer: Self-pay

## 2024-02-06 DIAGNOSIS — H6502 Acute serous otitis media, left ear: Secondary | ICD-10-CM | POA: Insufficient documentation

## 2024-02-06 DIAGNOSIS — H65192 Other acute nonsuppurative otitis media, left ear: Secondary | ICD-10-CM | POA: Insufficient documentation

## 2024-02-06 DIAGNOSIS — H9202 Otalgia, left ear: Secondary | ICD-10-CM | POA: Diagnosis present

## 2024-02-06 MED ORDER — IBUPROFEN 600 MG PO TABS: 600.0000 mg | ORAL_TABLET | Freq: Three times a day (TID) | ORAL | 0 refills | Status: AC | PRN

## 2024-02-06 MED ORDER — IBUPROFEN 600 MG PO TABS
600.0000 mg | ORAL_TABLET | Freq: Once | ORAL | Status: AC
  Administered 2024-02-06: 600 mg via ORAL
  Filled 2024-02-06: qty 1

## 2024-02-06 MED ORDER — PREDNISONE 20 MG PO TABS
60.0000 mg | ORAL_TABLET | Freq: Once | ORAL | Status: AC
Start: 2024-02-06 — End: 2024-02-06
  Administered 2024-02-06: 60 mg via ORAL
  Filled 2024-02-06: qty 3

## 2024-02-06 MED ORDER — PREDNISONE 50 MG PO TABS: 50.0000 mg | ORAL_TABLET | Freq: Every day | ORAL | 0 refills | Status: AC

## 2024-02-06 NOTE — ED Provider Notes (Signed)
 [facility] Provider Note    Event Date/Time   First MD Initiated Contact with Patient 02/06/24 1820     (approximate)   History   Otalgia (left)    HPI  Dustin Stone is a 47 y.o. male    with a past medical history of anaphylaxis, otitis media, cellulitis,  who presents to the ED complaining of left ear pain. According to the patient, symptoms started 5 days ago with ear pain.  He was seen by his PCP on Wednesday who prescribed amoxicillin  to treat left otitis media.  Patient continue with ear pain, he took a Herbon today and the pain got worse.  Patient has history of sinus infection 3 weeks ago.  Patient denies fever, chills, cough, nasal congestion, chest pain, abdominal pain or diarrhea.  Patient is here with his wife.  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   Irritable bowel- with wrong foods- Diarh 05/06/2016   Family history of diabetes mellitus 05/06/2016   Family history of coronary artery disease 05/06/2016   Family history of hyperlipidemia 05/06/2016   Family history of hypertension 05/06/2016   Family history of colonic polyps- all benign 05/06/2016     ROS: Patient currently denies any vision changes, tinnitus, difficulty speaking, facial droop, sore throat, chest pain, shortness of breath, abdominal pain, nausea/vomiting/diarrhea, dysuria, or weakness/numbness/paresthesias in any extremity   Physical Exam   Triage Vital Signs: ED Triage Vitals [02/06/24 1801]  Encounter Vitals Group     BP (!) 161/85     Girls Systolic BP Percentile      Girls Diastolic BP Percentile      Boys Systolic BP Percentile      Boys Diastolic BP Percentile      Pulse Rate (!) 55     Resp 16     Temp 98.3 F (36.8 C)     Temp Source Oral      SpO2 98 %     Weight      Height 5' 10 (1.778 m)     Head Circumference      Peak Flow      Pain Score 10     Pain Loc      Pain Education      Exclude from Growth Chart     Most recent vital signs: Vitals:   02/06/24 1801 02/06/24 1823  BP: (!) 161/85   Pulse: (!) 55   Resp: 16   Temp: 98.3 F (36.8 C)   SpO2: 98% 98%     Physical Exam Vitals and nursing note reviewed.  In triage patient was hypertensive, bradycardic.  Constitutional:      General: Awake and alert. No acute distress.    Appearance: Normal appearance. The patient is normal weight.      Able to speak in complete sentences without cough or dyspnea  HENT:     Head: Normocephalic and atraumatic.  Tenderness to palpation in left frontal sinus, left temporal area.    Mouth: Mucous membranes are moist.  Left ear: Left otoscopy: Peritympanic erythema, bulging of the tympanic membrane, no evidence of otorrhea.  Evidence of fluid behind the tympanic membrane.  Eyes:     General: PERRL. Normal EOMs  Conjunctiva/sclera: Conjunctivae normal.  Nose No congestion/rhinorrhea  CV:                  Good peripheral perfusion.  Regular rate and rhythm  Resp:               Normal effort.  Equal breath sounds bilaterally.  Abd:                 No distention.  Soft, nontender.  No rebound or guarding.  Musculoskeletal:        General: No swelling. Normal range of motion.  Skin:    General: Skin is warm and dry.     Capillary Refill: Capillary refill takes less than 2 seconds.     Findings: No rash.  Neurological:     Mental Status: The patient is awake and alert. MAE spontaneously. No gross focal neurologic deficits are appreciated.  Psychiatric Mood and affect are normal. Speech and behavior are normal.  ED Results / Procedures / Treatments   Labs (all labs ordered are listed, but only abnormal results are displayed) Labs Reviewed - No data to display        PROCEDURES:  Critical Care performed:    Procedures   MEDICATIONS ORDERED IN ED: Medications  predniSONE  (DELTASONE ) tablet 60 mg (has no administration in time range)  ibuprofen  (ADVIL ) tablet 600 mg (has no administration in time range)      IMPRESSION / MDM / ASSESSMENT AND PLAN / ED COURSE  I reviewed the triage vital signs and the nursing notes.  Differential diagnosis includes, but is not limited to, ear effusion, left otitis media, sinus infection, unlikely foreign body  Patient's presentation is most consistent with acute, uncomplicated illness.    Dustin Stone is a 48 y.o., male who presents today with history of 5 days of left ear pain,.  Patient was diagnosed with left otitis media on Wednesday he is taking amoxicillin  he is on third day of treatment . On a physical exam, left otoscopy showed Perry tympanic erythema, bulging of the tympanic membrane, fluid behind the tympanic membrane, no otorrhea.  Tenderness to palpation in the left frontal sinuses, left temporal area.  Rest of physical exam is within normal limits. Patient's diagnosis is consistent with left ear effusion, left ear otitis media. I did not order any imaging or labs, physical exam is reassuring. I did review the patient's allergies and medications.The patient is in stable and satisfactory condition for discharge home  Patient will be discharged home with prescriptions for prednisone , ibuprofen . Patient is to follow up with ENT as needed or otherwise directed. Patient is given ED precautions to return to the ED for any worsening or new symptoms.  I did advise patient to do nasal rinses. Discussed plan of care with patient, answered all of patient's questions, Patient agreeable to plan of care. Advised patient to take medications according to the instructions on the label. Discussed possible side effects of new medications. Patient verbalized understanding.  FINAL CLINICAL IMPRESSION(S) / ED DIAGNOSES   Final diagnoses:  Acute effusion of left  ear  Non-recurrent acute serous otitis media of left ear     Rx / DC Orders   ED Discharge Orders          Ordered    ibuprofen  (ADVIL ) 600 MG tablet  Every 8 hours PRN        02/06/24 1931    predniSONE  (DELTASONE ) 50 MG tablet  Daily with breakfast  02/06/24 1931             Note:  This document was prepared using Dragon voice recognition software and may include unintentional dictation errors.   Janit Kast, PA-C 02/06/24 1931    Bradler, Evan K, MD 02/06/24 [phone]

## 2024-02-06 NOTE — ED Triage Notes (Signed)
 Pt to ed from home via POV for left sided ear pain. Pt was seen by PCP on Wednesday and placed on antibiotics for ear infection and isnt feeling better. Pt just got off a flight 3 hours ago and felt like the pressure is worse. Pt is caox4, in no acute distress and ambulatory in triage.

## 2024-02-06 NOTE — Discharge Instructions (Addendum)
 You have been diagnosed with left ear effusion, acute serous otitis media of the left ear.  Please take ibuprofen  1 tablet every 8 hours after main meals.  Please continue taking amoxicillin  . please take prednisone  1 tablet with breakfast.  Please call ENT and make an appointment for a follow-up.  Come back to ED or go to your PCP if you have any symptoms symptoms worsen.  It was a pleasure to help you today.

## 2024-02-08 ENCOUNTER — Telehealth: Payer: Self-pay

## 2024-02-08 NOTE — Telephone Encounter (Signed)
  Hi Dustin Stone, Your blood work results are back!  Kidney and liver function look great.  Lipid/cholesterol panel revealed a mildly elevated LDL or bad cholesterol.  I would recommend increasing your exercise and avoiding foods that are high in saturated fats (fried foods, cheeses, butter, pork, mayonnaise based dips, etc.).  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  6.5 and higher is considered type 2 diabetes.  Lets plan to recheck the blood work in 6 months and see if it has improved.  No anemias.  Thyroid function normal.  Vitamin D  within normal range.  Please let me know if you have questions!

## 2024-02-10 ENCOUNTER — Telehealth: Payer: Self-pay | Admitting: *Deleted

## 2024-02-10 NOTE — Telephone Encounter (Signed)
 Contacted pt and appt scheduled.

## 2024-02-10 NOTE — Telephone Encounter (Signed)
 Copied from CRM [id number]. Topic: Clinical - Request for Lab/Test Order >> Feb 08, 2024  9:36 AM Debby BROCKS wrote: Reason for CRM: Patient was advised that he needs Cholesterol and A1C blood work to be scheduled for him. No orders appeared for these labs on patients chart

## 2024-03-09 ENCOUNTER — Telehealth: Payer: Self-pay

## 2024-03-09 NOTE — Telephone Encounter (Signed)
 Patient has been contacted by provider

## 2024-04-19 ENCOUNTER — Encounter (HOSPITAL_BASED_OUTPATIENT_CLINIC_OR_DEPARTMENT_OTHER): Payer: Self-pay

## 2024-04-19 ENCOUNTER — Ambulatory Visit (HOSPITAL_BASED_OUTPATIENT_CLINIC_OR_DEPARTMENT_OTHER)
Admission: RE | Admit: 2024-04-19 | Discharge: 2024-04-19 | Disposition: A | Discharge status: Home / Self Care | Attending: Family Medicine

## 2024-04-19 VITALS — BP 128/84 | HR 70 | Temp 98.2°F | Resp 20

## 2024-04-19 DIAGNOSIS — J069 Acute upper respiratory infection, unspecified: Secondary | ICD-10-CM

## 2024-04-19 MED ORDER — PROMETHAZINE-DM 6.25-15 MG/5ML PO SYRP: 5.0000 mL | ORAL_SOLUTION | Freq: Every evening | ORAL | 0 refills | Status: AC

## 2024-04-19 MED ORDER — PREDNISONE 20 MG PO TABS: 40.0000 mg | ORAL_TABLET | Freq: Every day | ORAL | 0 refills | Status: AC

## 2024-04-19 NOTE — ED Provider Notes (Signed)
 [facility]    CSN: [id number] Arrival date & time: 04/19/24  0854      History   Chief Complaint Chief Complaint  Patient presents with   Cough    Cough and congestion contracted an upper respiratory infection from my daughter. - Entered by patient    HPI Dustin Stone is a 48 y.o. male.   Pt c/o cough, nasal congestion, body aches, fatigue, and facial pressure since Sunday. Pt states that his daughter started to get the same symptoms last Wednesday and started to be treated for an upper respiratory infection on Sunday. Pt has taken mucinex and dayquil with symptom relief.     Cough   History reviewed. No pertinent past medical history.  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 Authorizing Provider  predniSONE  (DELTASONE ) 20 MG tablet Take 2 tablets (40 mg total) by mouth daily with breakfast for 5 days. 04/19/24 04/24/24 Yes Orien Mayhall A, FNP  promethazine-dextromethorphan (PROMETHAZINE-DM) 6.25-15 MG/5ML syrup Take 5 mLs by mouth at bedtime. 04/19/24  Yes Kenda Kloehn A, FNP  cetirizine  (ZYRTEC ) 10 MG tablet Take 1 tablet (10 mg total) by mouth daily. 02/03/24   Teressa Harrie HERO, FNP  EPINEPHrine  0.3 mg/0.3 mL IJ SOAJ injection Inject 0.3 mg into the muscle as needed for anaphylaxis. 11/28/23    Tegeler, Lonni PARAS, MD  fluticasone  (FLONASE ) 50 MCG/ACT nasal spray Place 2 sprays into both nostrils daily. 02/03/24   Teressa Harrie HERO, FNP  Probiotic Product (PROBIOTIC PO) Take 1 capsule by mouth at bedtime.    [provider]  sildenafil  (VIAGRA ) 50 MG tablet Take 1 tablet (50 mg total) by mouth daily as needed for erectile dysfunction. 01/21/24   Gayle Saddie FALCON, PA-C    Family History Family History  Problem Relation Age of Onset   Heart attack Mother    Diabetes Mother    Heart attack Father    Diabetes Father    Hypertension Father    Hyperlipidemia Father    Multiple sclerosis Sister    Healthy Brother    Cancer Maternal Grandmother        breast   Heart attack Maternal Grandmother    Heart attack Paternal Grandfather    Healthy Brother    Healthy Brother     Social History Social History   Tobacco Use   Smoking status: Never   Smokeless tobacco: Never  Substance Use Topics   Alcohol use: No   Drug use: No     Allergies   Bee venom and Fish allergy   Review of Systems Review of Systems  Respiratory:  Positive for cough.      Physical Exam Triage Vital Signs ED Triage Vitals  Encounter Vitals Group     BP 04/19/24 0925 128/84  Girls Systolic BP Percentile --      Girls Diastolic BP Percentile --      Boys Systolic BP Percentile --      Boys Diastolic BP Percentile --      Pulse Rate 04/19/24 0925 70     Resp 04/19/24 0925 20     Temp 04/19/24 0925 98.2 F (36.8 C)     Temp Source 04/19/24 0925 Oral     SpO2 04/19/24 0925 95 %     Weight --      Height --      Head Circumference --      Peak Flow --      Pain Score 04/19/24 0924 4     Pain Loc --      Pain Education --      Exclude from Growth Chart --    No data found.  Updated Vital Signs BP 128/84 (BP Location: Right Arm)   Pulse 70   Temp 98.2 F (36.8 C) (Oral)   Resp 20   SpO2 95%   Visual Acuity Right Eye Distance:   Left Eye Distance:   Bilateral Distance:     Right Eye Near:   Left Eye Near:    Bilateral Near:     Physical Exam Constitutional:      General: He is not in acute distress.    Appearance: Normal appearance. He is not ill-appearing, toxic-appearing or diaphoretic.  HENT:     Right Ear: Tympanic membrane, ear canal and external ear normal.     Left Ear: Tympanic membrane, ear canal and external ear normal.     Mouth/Throat:     Pharynx: Oropharynx is clear.  Eyes:     Conjunctiva/sclera: Conjunctivae normal.  Cardiovascular:     Rate and Rhythm: Normal rate and regular rhythm.     Pulses: Normal pulses.     Heart sounds: Normal heart sounds.  Pulmonary:     Effort: Pulmonary effort is normal.     Breath sounds: Wheezing present.  Musculoskeletal:        General: Normal range of motion.  Skin:    General: Skin is warm and dry.  Neurological:     Mental Status: He is alert.  Psychiatric:        Mood and Affect: Mood normal.      UC Treatments / Results  Labs (all labs ordered are listed, but only abnormal results are displayed) Labs Reviewed - No data to display  EKG   Radiology No results found.  Procedures Procedures (including critical care time)  Medications Ordered in UC Medications - No data to display  Initial Impression / Assessment and Plan / UC Course  I have reviewed the triage vital signs and the nursing notes.  Pertinent labs & imaging results that were available during my care of the patient were reviewed by me and considered in my medical decision making (see chart for details).     Viral URI with cough-medications as prescribed.  Recommend Zyrtec  and Flonase .  Cough medication at bedtime as prescribed.  Follow-up as needed Final Clinical Impressions(s) / UC Diagnoses   Final diagnoses:  Viral URI with cough     Discharge Instructions      Take the prednisone  as prescribed. Make sure that you are taking the allergy medications. Zyrtec  and Flonase . Cough medication at bedtime  as prescribed.  Follow up as needed.      ED Prescriptions     Medication Sig Dispense  Auth. Provider   predniSONE  (DELTASONE ) 20 MG tablet Take 2 tablets (40 mg total) by mouth daily with breakfast for 5 days. 10 tablet Walt Geathers A, FNP   promethazine-dextromethorphan (PROMETHAZINE-DM) 6.25-15 MG/5ML syrup Take 5 mLs by mouth at bedtime. 118 mL Adah Corning A, FNP      PDMP not reviewed this encounter.   Adah Corning LABOR, FNP 04/19/24 [phone]

## 2024-04-19 NOTE — ED Triage Notes (Signed)
 Pt c/o cough, nasal congestion, body aches, fatigue, and facial pressure since Sunday. Pt states that his daughter started to get the same symptoms last Wednesday and started to be treated for an upper respiratory infection on Sunday. Pt has taken mucinex and dayquil with symptom relief.

## 2024-04-19 NOTE — Discharge Instructions (Addendum)
 Take the prednisone  as prescribed. Make sure that you are taking the allergy medications. Zyrtec  and Flonase . Cough medication at bedtime as prescribed.  Follow up as needed.

## 2024-08-03 ENCOUNTER — Other Ambulatory Visit

## 2025-01-13 ENCOUNTER — Other Ambulatory Visit

## 2025-01-20 ENCOUNTER — Encounter
